# Patient Record
Sex: Male | Born: 1945
Health system: Southern US, Community
[De-identification: ages and names within clinical notes are randomized; demographics above are authoritative.]

## PROBLEM LIST (undated history)

## (undated) DIAGNOSIS — M1990 Unspecified osteoarthritis, unspecified site: Secondary | ICD-10-CM

## (undated) DIAGNOSIS — H269 Unspecified cataract: Secondary | ICD-10-CM

## (undated) DIAGNOSIS — I10 Essential (primary) hypertension: Secondary | ICD-10-CM

## (undated) DIAGNOSIS — E119 Type 2 diabetes mellitus without complications: Secondary | ICD-10-CM

## (undated) HISTORY — DX: Essential (primary) hypertension: I10

## (undated) HISTORY — DX: Unspecified cataract: H26.9

## (undated) HISTORY — DX: Type 2 diabetes mellitus without complications: E11.9

## (undated) HISTORY — DX: Unspecified osteoarthritis, unspecified site: M19.90

## (undated) HISTORY — PX: PILONIDAL CYST EXCISION: SHX744

## (undated) HISTORY — PX: COLONOSCOPY: SHX174

---

## 1998-09-09 ENCOUNTER — Emergency Department (HOSPITAL_COMMUNITY): Admission: EM | Admit: 1998-09-09 | Discharge: 1998-09-09 | Payer: Self-pay

## 1999-10-01 ENCOUNTER — Encounter: Payer: Self-pay | Admitting: Internal Medicine

## 1999-10-01 ENCOUNTER — Encounter: Admission: RE | Admit: 1999-10-01 | Discharge: 1999-10-01 | Payer: Self-pay | Admitting: Internal Medicine

## 2001-05-19 ENCOUNTER — Encounter: Payer: Self-pay | Admitting: Internal Medicine

## 2001-05-19 ENCOUNTER — Encounter: Admission: RE | Admit: 2001-05-19 | Discharge: 2001-05-19 | Payer: Self-pay | Admitting: Internal Medicine

## 2002-08-14 ENCOUNTER — Emergency Department (HOSPITAL_COMMUNITY): Admission: EM | Admit: 2002-08-14 | Discharge: 2002-08-14 | Payer: Self-pay | Admitting: *Deleted

## 2002-08-26 ENCOUNTER — Emergency Department (HOSPITAL_COMMUNITY): Admission: EM | Admit: 2002-08-26 | Discharge: 2002-08-26 | Payer: Self-pay | Admitting: *Deleted

## 2006-05-18 ENCOUNTER — Emergency Department (HOSPITAL_COMMUNITY): Admission: EM | Admit: 2006-05-18 | Discharge: 2006-05-19 | Payer: Self-pay | Admitting: Emergency Medicine

## 2006-06-04 ENCOUNTER — Ambulatory Visit: Payer: Self-pay | Admitting: Cardiovascular Disease

## 2006-06-11 ENCOUNTER — Ambulatory Visit: Payer: Self-pay | Admitting: Cardiovascular Disease

## 2006-06-11 ENCOUNTER — Ambulatory Visit (HOSPITAL_COMMUNITY): Admission: RE | Admit: 2006-06-11 | Discharge: 2006-06-11 | Payer: Self-pay | Admitting: Cardiovascular Disease

## 2008-05-03 ENCOUNTER — Ambulatory Visit: Payer: Self-pay | Admitting: Cardiovascular Disease

## 2009-07-21 DIAGNOSIS — E119 Type 2 diabetes mellitus without complications: Secondary | ICD-10-CM | POA: Insufficient documentation

## 2009-07-21 DIAGNOSIS — R079 Chest pain, unspecified: Secondary | ICD-10-CM | POA: Insufficient documentation

## 2009-07-21 DIAGNOSIS — I10 Essential (primary) hypertension: Secondary | ICD-10-CM | POA: Insufficient documentation

## 2009-07-24 ENCOUNTER — Ambulatory Visit: Payer: Self-pay | Admitting: Cardiovascular Disease

## 2009-07-24 DIAGNOSIS — E782 Mixed hyperlipidemia: Secondary | ICD-10-CM

## 2010-10-22 ENCOUNTER — Ambulatory Visit: Payer: Self-pay | Admitting: Cardiovascular Disease

## 2010-10-22 ENCOUNTER — Encounter: Payer: Self-pay | Admitting: Cardiovascular Disease

## 2010-12-12 NOTE — Assessment & Plan Note (Signed)
Summary: N5A   Primary Provider:  Elmore Guise, MD  CC:  1 year ROV; No Complaints.  History of Present Illness: Glenn Moore is seen today for f/U of SSCP, elevated lipids and HTN.  His primary is [name].  He had a normal cardac CTA in 2007 and a calcium score of 12.  His diabetes is under borderline control.. I don't have and A1c but his sugars at home are running 130-140.  He gets occasional chest pain which sounds muscular and is delayed after he does something strenuous at work.  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  There is no associated SOB, diaphoresis, palpitations or syncope.  He is active and plays golf without problems  I think his BP could be better controlled and he was quite a bit higher today.  Also discussed low carb diet as he has A1C of 6.5 or higher  Current Problems (verified): 1)  Mixed Hyperlipidemia  (ICD-272.2) 2)  Hypertension  (ICD-401.9) 3)  Dm  (ICD-250.00) 4)  Chest Pain  (ICD-786.50)  Current Medications (verified): 1)  Metformin Hcl 850 Mg Tabs (Metformin Hcl) .Marland Kitchen.. 1 Tab By Mouth Two Times A Day 2)  Glimepiride 4 Mg Tabs (Glimepiride) .... 1/2 Tab Once Daily 3)  Doxazosin Mesylate 4 Mg Tabs (Doxazosin Mesylate) .Marland Kitchen.. 1 Tab By Mouth Once Daily 4)  Metoprolol Succinate 50 Mg Xr24h-Tab (Metoprolol Succinate) .... 1/2 Tab By Mouth Once Daily 5)  Lisinopril 10 Mg Tabs (Lisinopril) .... Take One Tablet By Mouth Daily 6)  Crestor 10 Mg Tabs (Rosuvastatin Calcium) .... Take 1 By Mouth Once Daily 7)  Clonazepam 0.5 Mg Tabs (Clonazepam) .... As Needed 8)  Vitamin C .... Take 1 By Mouth Once Daily  Allergies (verified): No Known Drug Allergies  Past History:  Past Medical History: Last updated: 07/21/2009 Current Problems:  HYPERTENSION (ICD-401.9) DM (ICD-250.00) CHEST PAIN (ICD-786.50) normal cardiac CT 06/2006 calcium score 12  Family History: Last updated: 07/21/2009  Remarkable for cardiac disease on the father's side.  Social History: Last updated:  07/21/2009  He is happily married.  He owns a Technical brewer.  He has  three children, all who have graduated from college, two from [place] and  one from [place].  Review of Systems       Denies fever, malais, weight loss, blurry vision, decreased visual acuity, cough, sputum, SOB, hemoptysis, pleuritic pain, palpitaitons, heartburn, abdominal pain, melena, lower extremity edema, claudication, or rash.   Vital Signs:  Patient profile:   65 year old male Height:      74 inches Weight:      210 pounds BMI:     27.06 Pulse rate:   74 / minute Pulse rhythm:   regular BP sitting:   158 / 90  (left arm) Cuff size:   regular  Vitals Entered By: Stanton Kidney, EMT-P (October 22, 2010 10:22 AM)  Physical Exam  General:  Affect appropriate Healthy:  appears stated age HEENT: normal Neck supple with no adenopathy JVP normal no bruits no thyromegaly Lungs clear with no wheezing and good diaphragmatic motion Heart:  S1/S2 no murmur,rub, gallop or click PMI normal Abdomen: benighn, BS positve, no tenderness, no AAA no bruit.  No HSM or HJR Distal pulses intact with no bruits No edema Neuro non-focal Skin warm and dry    Impression & Recommendations:  Problem # 1:  MIXED HYPERLIPIDEMIA (ICD-272.2) Continue statin  Labs with primary His updated medication list for this problem includes:    Crestor 10 Mg  Tabs (Rosuvastatin calcium) .Marland Kitchen... Take 1 by mouth once daily  Orders: EKG w/ Interpretation (93000)  Problem # 2:  HYPERTENSION (ICD-401.9)  Increase Lisionpril  Home readings  F/U me 3 months His updated medication list for this problem includes:    Doxazosin Mesylate 4 Mg Tabs (Doxazosin mesylate) .Marland Kitchen... 1 tab by mouth once daily    Metoprolol Succinate 50 Mg Xr24h-tab (Metoprolol succinate) .Marland Kitchen... 1/2 tab by mouth once daily    Lisinopril 10 Mg Tabs (Lisinopril) .Marland Kitchen... Take one tablet by mouth daily  Orders: EKG w/ Interpretation (93000)  His updated medication list for  this problem includes:    Doxazosin Mesylate 4 Mg Tabs (Doxazosin mesylate) .Marland Kitchen... 1 tab by mouth once daily    Metoprolol Succinate 50 Mg Xr24h-tab (Metoprolol succinate) .Marland Kitchen... 1/2 tab by mouth once daily    Lisinopril 20 Mg Tabs (Lisinopril) ..... One daily  Problem # 3:  DM (ICD-250.00) Discussed [place] type diet  Lots of room for improvement.  Continue 2 oral hypoglycemis His updated medication list for this problem includes:    Metformin Hcl 850 Mg Tabs (Metformin hcl) .Marland Kitchen... 1 tab by mouth two times a day    Glimepiride 4 Mg Tabs (Glimepiride) .Marland Kitchen... 1/2 tab once daily    Lisinopril 10 Mg Tabs (Lisinopril) .Marland Kitchen... Take one tablet by mouth daily  Orders: EKG w/ Interpretation (93000)  Problem # 4:  CHEST PAIN (ICD-786.50) Non recurrent  Normal ECG in office today His updated medication list for this problem includes:    Metoprolol Succinate 50 Mg Xr24h-tab (Metoprolol succinate) .Marland Kitchen... 1/2 tab by mouth once daily    Lisinopril 20 Mg Tabs (Lisinopril) ..... One daily  Patient Instructions: 1)  Your physician recommends that you schedule a follow-up appointment in: 3 months with Dr Eden Emms 2)  Your physician has recommended you make the following change in your medication: increase Lisinopril 20 mg a day Prescriptions: LISINOPRIL 20 MG TABS (LISINOPRIL) one daily  #30 x 11   Entered by:   Charolotte Capuchin, RN   Authorized by:   Colon Branch, MD, [facility]   Signed by:   Charolotte Capuchin, RN on 10/22/2010   Method used:   Electronically to        [facility]. [phone]* (retail)       [phone]       [place], [place]  [address]       Ph: [phone]       Fax: [phone]   RxID:   [id number]    EKG Report  Procedure date:  10/22/2010  Findings:      NSR 74 normal ECG

## 2011-02-21 ENCOUNTER — Encounter: Payer: Self-pay | Admitting: Cardiovascular Disease

## 2011-02-25 ENCOUNTER — Ambulatory Visit (INDEPENDENT_AMBULATORY_CARE_PROVIDER_SITE_OTHER): Payer: BLUE CROSS/BLUE SHIELD | Admitting: Cardiovascular Disease

## 2011-02-25 ENCOUNTER — Encounter: Payer: Self-pay | Admitting: Cardiovascular Disease

## 2011-02-25 DIAGNOSIS — E119 Type 2 diabetes mellitus without complications: Secondary | ICD-10-CM

## 2011-02-25 DIAGNOSIS — I10 Essential (primary) hypertension: Secondary | ICD-10-CM

## 2011-02-25 DIAGNOSIS — E782 Mixed hyperlipidemia: Secondary | ICD-10-CM

## 2011-02-25 DIAGNOSIS — R079 Chest pain, unspecified: Secondary | ICD-10-CM

## 2011-02-25 NOTE — Assessment & Plan Note (Signed)
Well controlled.  Continue current medications and low sodium [place] type diet.    

## 2011-02-25 NOTE — Assessment & Plan Note (Signed)
At goal. Labs per Dr Chilton Si

## 2011-02-25 NOTE — Assessment & Plan Note (Signed)
Resolved F/U cardiac CT if symptoms return

## 2011-02-25 NOTE — Progress Notes (Signed)
Glenn Moore is seen today for f/U of SSCP, elevated lipids and HTN.  His primary is [name].  He had a normal cardac CTA in 2007 and a calcium score of 12.  His diabetes is under borderline control.. I don't have and A1c but his sugars at home are running 130-140.  He had his BS medicine adjusted an is running better. No SSCP, dyspnea or palpitaitons.  Golfing at [place] a lot.  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  There is no associated SOB, diaphoresis, palpitations or syncope.  He is active and plays golf without problems  ROS: Denies fever, malais, weight loss, blurry vision, decreased visual acuity, cough, sputum, SOB, hemoptysis, pleuritic pain, palpitaitons, heartburn, abdominal pain, melena, lower extremity edema, claudication, or rash.   General: Affect appropriate Healthy:  appears stated age HEENT: normal Neck supple with no adenopathy JVP normal no bruits no thyromegaly Lungs clear with no wheezing and good diaphragmatic motion Heart:  S1/S2 no murmur,rub, gallop or click PMI normal Abdomen: benighn, BS positve, no tenderness, no AAA no bruit.  No HSM or HJR Distal pulses intact with no bruits No edema Neuro non-focal Skin warm and dry No muscular weakness   Current Outpatient Prescriptions  Medication Sig Dispense Refill  . clonazePAM (KLONOPIN) 0.5 MG tablet Take 0.5 mg by mouth 2 (two) times daily as needed.        . doxazosin (CARDURA) 4 MG tablet Take 4 mg by mouth at bedtime.        Marland Kitchen glimepiride (AMARYL) 4 MG tablet 1 1/2 tab po qd      . lisinopril (PRINIVIL,ZESTRIL) 20 MG tablet Take 20 mg by mouth daily.        . metFORMIN (GLUCOPHAGE) 850 MG tablet Take 850 mg by mouth 2 (two) times daily with a meal.        . metoprolol (TOPROL-XL) 50 MG 24 hr tablet 1/2 tab po qd       . rosuvastatin (CRESTOR) 10 MG tablet Take 10 mg by mouth daily.          Allergies  Review of patient's allergies indicates no known allergies.  Electrocardiogram:  Assessment and  Plan

## 2011-02-25 NOTE — Patient Instructions (Signed)
Your physician recommends that you schedule a follow-up appointment in: ONE YEAR 

## 2011-02-25 NOTE — Assessment & Plan Note (Signed)
Improved with additin of glimperide  F/U Dr Chilton Si

## 2011-03-25 NOTE — Assessment & Plan Note (Signed)
[facility]                            CARDIOLOGY OFFICE NOTE   NAME:Glenn Moore, Glenn Moore                    MRN:          [id number]  DATE:05/03/2008                            DOB:          10-01-1946    Glenn Moore returns today for followup.  He has had previous chest pain.  It  has been atypical and it is occasionally recurrent and it sounds  musculoskeletal.  He had a cardiac CT 2 years ago, which showed a  calcium score of 12, low normal EF, and no significant coronary disease.   Unfortunately, Glenn Moore's blood sugar continues to be poorly controlled.  His fastings tend to be 140-150.  I do not have a hemoglobin A1c on him  and he does not know the exact dosages of his medications.   Aside from atypical pain, he is doing well.  He is not having exertional  dyspnea.   We had a long discussion regarding this.  He needs to see a  nutritionist.  I will try to get him to see Harvette Collins off  [address].   The patient understands that he needs to be a little bit stricter in  following his sugars.  I gave him a target hemoglobin A1c of below 6.5.  I encouraged him to follow up with Dr. Chilton Si.  His blood pressure  continues to be elevated.  He is a diabetic and should be on an ACE  inhibitor for its renal protective effects.   REVIEW OF SYSTEMS:  Otherwise, negative.   Meds:  Incomplete list.  Patient to call in accurate list.  See below   PHYSICAL EXAMINATION:  GENERAL:  Remarkable for being overweight at 211  pounds.  Blood pressure was 175/90,  pulse 83 and regular, respiratory  rate 14, afebrile.  HEENT:  Unremarkable.  Carotids normal, without bruit, no  lymphadenopathy, thyromegaly or JVP elevation.  LUNGS:  Clear.  Good diaphragmatic motion.  No wheezing.  HEART:  S1 and S2.  Normal heart sounds, PMI normal.  ABDOMEN:  Normal bowel sounds, positive, no AAA, no bruit, no  tenderness, no hepatosplenomegaly.  No hepatojugular reflux, distal  pulses were intact, no edema.  NEURO:  Nonfocal.  SKIN:  Warm and dry.  No muscular weakness.   EKG is entirely normal.   IMPRESSION:  1. Atypical chest pain.  Cardiac CT 2 years ago, normal with calcium      score 12, possible followup Myoview in a year or 2 if diabetes      continues to be poorly controlled.  2. Diabetes.  Follow with Dr. Nila Nephew, continue metformin b.i.d.,      may need the addition of Januvia.  3. Hypertension.  Continue metoprolol, dose unknown per the patient.      He will call him with his med list, add lisinopril 10 mg a day.  I      gave him a script to make sure Dr. Nila Nephew checks the BMET in 6-      8 weeks.   I will see him back in a year's time.  Noralyn Pick. Eden Emms, MD, [facility]  Electronically Signed    PCN/MedQ  DD: 05/03/2008  DT: 05/04/2008  Job #: [id number]   cc:   Erskine Speed, M.D.

## 2011-03-28 NOTE — Assessment & Plan Note (Signed)
[facility]                              CARDIOLOGY OFFICE NOTE   NAME:Glenn Moore, Glenn Moore                    MRN:          [id number]  DATE:06/04/2006                            DOB:          08/17/1946    REASON FOR VISIT:  Glenn Moore is a delightful 65 year old patient of Dr.  Elmore Guise.  He was in the ER two weeks ago for chest pain.   He apparently a long time ago has had chest pain back in 2002.  He does not  recall it, but I have a record of a bone scan which was negative at the  time.   The patient has had previous duodenitis.  The pain was somewhat similar, but  he had some exacerbation with walking.  He also felt palpitations and was  found to have PVCs in the ER.   He ruled out for myocardial infarction and was sent home for cardiologic  followup.   Since that time, he has been started on Glucophage for diabetes.  He has a  positive family history for premature coronary disease.  His father was a  patient of ours and had CABG in his 22s.   The patient does not smoke.   I am unaware of his lipid levels.   The patient does not have any significant previous surgical history, except  for pilonidal cyst.   He has rosacea and is on __________ for this per Dr. Romeo Apple.   He does have hypertension and has been taking Toprol-XL 25 mg a day and  Cardura 4 mg a day.   REVIEW OF SYSTEMS:  Otherwise benign.   FAMILY HISTORY:  Remarkable for cardiac disease on the father's side.   ALLERGIES:  THE PATIENT HAS NO KNOWN ALLERGIES.   SOCIAL HISTORY:  He is happily married.  He owns a Technical brewer.  He has  three children, all who have graduated from college, two from [place] and  one from [place].   His daughter just passed the bar exam.   They are all healthy and doing well.   PHYSICAL EXAMINATION:  VITAL SIGNS:  Blood pressure 128/78, pulse 70 and  regular.  LUNGS:  Clear.  NECK:  Carotids normal.  HEART:  There is an S1 and  S2.  Normal heart sounds.  ABDOMEN:  Benign.  EXTREMITIES:  Lower extremities with normal pulses, no edema.   DIAGNOSTIC STUDIES:  Baseline EKG is normal.   IMPRESSION:  The patient has multiple coronary risk factors, including  diabetes.   He has a relatively low heart rate and a good electrocardiogram.  I think  the ideal test for him to rule out structural heart disease in regards to  his premature ventricular contractions and also rule out coronary disease in  regards to his premature ventricular contractions would be a cardiac CT.   He is already on Toprol-XL.  We would have him take one tablet at night and  two the morning of.  We will try to get the cardiac CT cleared through  [facility].  If he cannot have a cardiac CT, he  will have to have a stress  Myoview and echocardiogram.   Further recommendations will be based on these tests.                               Noralyn Pick. Eden Emms, MD, [facility]    PCN/MedQ  DD:  06/04/2006  DT:  06/04/2006  Job #:  [id number]   cc:   Erskine Speed, MD

## 2012-03-10 ENCOUNTER — Telehealth: Payer: Self-pay | Admitting: Cardiovascular Disease

## 2012-03-10 NOTE — Telephone Encounter (Signed)
New Problem:     I called the patient and was unable to reach them. I left a message on their voicemail with my name, the reason I called, the name of his physician, and a number to call back to schedule their appointment. 

## 2012-05-11 ENCOUNTER — Ambulatory Visit: Payer: BLUE CROSS/BLUE SHIELD | Admitting: Cardiovascular Disease

## 2012-05-19 ENCOUNTER — Encounter: Payer: Self-pay | Admitting: Cardiovascular Disease

## 2012-05-19 ENCOUNTER — Ambulatory Visit (INDEPENDENT_AMBULATORY_CARE_PROVIDER_SITE_OTHER): Payer: BC Managed Care – PPO | Admitting: Cardiovascular Disease

## 2012-05-19 VITALS — BP 138/86 | HR 77 | Resp 18 | Ht 74.0 in | Wt 211.8 lb

## 2012-05-19 DIAGNOSIS — E119 Type 2 diabetes mellitus without complications: Secondary | ICD-10-CM

## 2012-05-19 DIAGNOSIS — I10 Essential (primary) hypertension: Secondary | ICD-10-CM

## 2012-05-19 DIAGNOSIS — R079 Chest pain, unspecified: Secondary | ICD-10-CM

## 2012-05-19 DIAGNOSIS — E782 Mixed hyperlipidemia: Secondary | ICD-10-CM

## 2012-05-19 NOTE — Addendum Note (Signed)
Addended by: Scherrie Bateman E on: 05/19/2012 10:00 AM   Modules accepted: Orders

## 2012-05-19 NOTE — Progress Notes (Signed)
Patient ID: Glenn Moore, male   DOB: 1946/01/12, 66 y.o.   MRN: [id number] Glenn Moore is seen today for f/U of SSCP, elevated lipids and HTN. His primary is Glenn Moore. He had a normal cardac CTA in 2007 and a calcium score of 12. His diabetes is under borderline control.. I don't have and A1c but his sugars at home are running 130-140. He had his BS medicine adjusted an is running better. No  dyspnea or palpitaitons. Golfing at [place] a lot. 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 There is no associated SOB, diaphoresis, palpitations or syncope. He is active and plays golf without problems  He does get occasional chest pain Seems more related to back and he gets a chiropractic adjustment occasionally.  Pain is crampy/sharp with exertion and radiates to the chest anteriorly.  Pain occurs couple times / month   ROS: Denies fever, malais, weight loss, blurry vision, decreased visual acuity, cough, sputum, SOB, hemoptysis, pleuritic pain, palpitaitons, heartburn, abdominal pain, melena, lower extremity edema, claudication, or rash.  All other systems reviewed and negative  General: Affect appropriate Healthy:  appears stated age HEENT: normal Neck supple with no adenopathy JVP normal no bruits no thyromegaly Lungs clear with no wheezing and good diaphragmatic motion Heart:  S1/S2 no murmur, no rub, gallop or click PMI normal Abdomen: benighn, BS positve, no tenderness, no AAA no bruit.  No HSM or HJR Distal pulses intact with no bruits No edema Neuro non-focal Skin warm and dry No muscular weakness   Current Outpatient Prescriptions  Medication Sig Dispense Refill  . doxazosin (CARDURA) 4 MG tablet Take 4 mg by mouth at bedtime.        Marland Kitchen glimepiride (AMARYL) 4 MG tablet 1 1/2 tab po qd      . lisinopril (PRINIVIL,ZESTRIL) 20 MG tablet Take 20 mg by mouth daily.        . metFORMIN (GLUCOPHAGE) 850 MG tablet Take 850 mg by mouth 2 (two) times daily with a meal.        . metoprolol  (TOPROL-XL) 50 MG 24 hr tablet 1/2 tab po qd       . rosuvastatin (CRESTOR) 10 MG tablet Take 10 mg by mouth daily.        . clonazePAM (KLONOPIN) 0.5 MG tablet Take 0.5 mg by mouth 2 (two) times daily as needed.          Allergies  Review of patient's allergies indicates no known allergies.  Electrocardiogram:  Assessment and Plan

## 2012-05-19 NOTE — Assessment & Plan Note (Signed)
Refer to nutritionalist.  Carbs in diet are a problem  He thinks last A1c was in the 7 range

## 2012-05-19 NOTE — Assessment & Plan Note (Signed)
Calcium score 12 in 2007 no functional study.  Poorly controlled DM  F/U ETT

## 2012-05-19 NOTE — Assessment & Plan Note (Signed)
Well controlled.  Continue current medications and low sodium [place] type diet.    

## 2012-05-19 NOTE — Patient Instructions (Signed)
Your physician has requested that you have an exercise tolerance test. For further information please visit [email or web address] Please also follow instruction sheet, as given. DX DM Your physician recommends that you continue on your current medications as directed. Please refer to the Current Medication list given to you today. You have been referred to NUTRITIONIST DX DM

## 2012-05-19 NOTE — Assessment & Plan Note (Signed)
Cholesterol is at goal.  Continue current dose of statin and diet Rx.  No myalgias or side effects.  F/U  LFT's in 6 months. No results found for this basename: LDLCALC             

## 2012-06-17 ENCOUNTER — Encounter: Payer: Self-pay | Admitting: Physician Assistant

## 2012-06-17 ENCOUNTER — Ambulatory Visit (INDEPENDENT_AMBULATORY_CARE_PROVIDER_SITE_OTHER): Payer: BC Managed Care – PPO | Admitting: Physician Assistant

## 2012-06-17 DIAGNOSIS — E119 Type 2 diabetes mellitus without complications: Secondary | ICD-10-CM

## 2012-06-17 NOTE — Procedures (Signed)
Exercise Treadmill Test  Pre-Exercise Testing Evaluation Rhythm: normal sinus  Rate: 70   PR:  .09 QRS:  .09  QT: .15 QTc: .41     Test  Exercise Tolerance Test Ordering MD: Charlton Haws, MD  Interpreting MD: Tereso Newcomer PA-C  Unique Test No: 1  Treadmill:  1  Indication for ETT: Diabetes  Contraindication to ETT: No   Stress Modality: exercise - treadmill  Cardiac Imaging Performed: non   Protocol: standard Bruce - maximal  Max BP:  211/69  Max MPHR (bpm):  157 85% MPR (bpm):  133  MPHR obtained (bpm):  151 % MPHR obtained:  96%  Reached 85% MPHR (min:sec):  7:37 Total Exercise Time (min-sec):  9:30  Workload in METS:  11.1 Borg Scale: 17  Reason ETT Terminated:  patient's desire to stop    ST Segment Analysis At Rest: normal ST segments - no evidence of significant ST depression With Exercise: no evidence of significant ST depression  Other Information Arrhythmia:  Rare PVC Angina during ETT:  absent (0) Quality of ETT:  diagnostic  ETT Interpretation:  normal - no evidence of ischemia by ST analysis  Comments: Good exercise tolerance. No chest pain. Normal BP response to exercise. No ST-T changes to suggest ischemia.   Recommendations: Follow up with Dr. Charlton Haws as directed. Tereso Newcomer, PA-C  11:16 AM 06/17/2012

## 2012-06-23 ENCOUNTER — Encounter: Payer: Self-pay | Admitting: Dietician

## 2012-06-23 ENCOUNTER — Encounter: Payer: BC Managed Care – PPO | Attending: Cardiovascular Disease | Admitting: Dietician

## 2012-06-23 VITALS — Ht 74.0 in | Wt 210.7 lb

## 2012-06-23 DIAGNOSIS — Z713 Dietary counseling and surveillance: Secondary | ICD-10-CM | POA: Insufficient documentation

## 2012-06-23 DIAGNOSIS — E119 Type 2 diabetes mellitus without complications: Secondary | ICD-10-CM | POA: Insufficient documentation

## 2012-06-23 NOTE — Progress Notes (Signed)
Medical Nutrition Therapy:  Appt start time: 1545 end time:  1655   Assessment:  Primary concerns today: Concerned that his A1C is increasing and he has been trying to adapt his diet to help with lowering his glucose levels. He has not had any formal diabetes education in the past and comes with some questions regarding his diet and glucose control. He currently is taking glimepiride and metformin for glucose control.  His wife comes today as a second set of ears,to help him with controlling his glucose.  HYPOGLYCEMIA: Gives some recent H/O having low blood glucose.  It happens most often on the warmer days when he has been more physically active.  It has occurred on the golf course and at home following golfing while he was mowing the lawn with a push mower.  It is most often at mid-day.  We reviewed the treatment for hypoglycemia and he was encouraged to get some glucose tabs to keep with him to treat a low when away from home.  HYPERGLYCEMIA:  No S/S noted for the overt 240-250 mg glucose levels.  BLOOD GLUCOSE LEVELS: Checking fasting glucose as a rule.  Generally will run in the 150-160 range.  Has had some episodes in the recent past where he was at 120-112 mg/dl at the fasting times.  MEDICATIONS: Med review completed.  Diabetes: Metformin 850 mg 1 two times per day.  Glimepiride 2 (4 mg tab) daily in the morning prior to the breakfast. Given the low blood glucose and the higher dose of the Glimepiride, this gentleman might be able to use one of the DDP-4 Inhibitors or the new SGLT2 Inhibitor (Invokana/canagliflozin).  Both might not pump the pancrease as the Glimepiride is doing.   DIETARY INTAKE:  Usual eating pattern includes 3 meals and none snacks per day.  Everyday foods include variety of meats, fruits, and vegetables.  Avoided foods include: liver, grits    24-hr recall:  B ( AM): 7:00 coffee  With a little cream or milk, Nutra grain bar or banana and sometimes maybe both.   Then; Ashawna Hanback have breakfast; [place] for a bacon, egg, biscuit with an unsweetened tea.  Snk ( AM): none  L ( PM): 12:30-1:30 Sandwich or hamburger BBQ slaw plate ,Jay's roast beef sandwich with the carrots today. D ( PM): 6:30-7:00 2 BBQ pork chops with potatoes, salad with some blue cheese dressing. 1-2 glasses of wine. Snk ( PM): no  Beverages: coffee, unsweetened, wine, water, or mixed drink occasionally. Sometimes an appetizer of healthy snacks with a mixed drink after golf.  Usual physical activity: golf 2-3 times per week       Estimated energy needs:Ht:74 in  Wt: 210.7 lb BMI: 27.1 kg/m2  Adj WT: 200 lb (91 kg) 1800 calories 200-205 g carbohydrates 130-135 g protein 48-50 g fat  Progress Towards Goal(s):  In progress.   Nutritional Diagnosis:  [place]-2.1 Inpaired nutrition utilization As related to blood glucose.  As evidenced by diagnosis of type 2 diabetes, fasting blood glucose of 140-150,,and A1C reported at 7.5%..    Intervention:  Nutrition Has been working to limit his carb intake.  Reviewed the carb restricted diet for DM.  Review of label reading and use of the exchanges for counting carbs.  Recommended aiming for 45-60 gm of carb per meal and to have a protein source at all meals and snacks   Handouts given during visit include:  Living Well with Diabetes  Carbohydrate Counting Guide  Yellow card with the  45-60 gm CHO prescription and the exchanges/portions  7 day menu suggestions for the 45 and 60 gm carb diets.  Monitoring/Evaluation:  Dietary intake, exercise, blood glucose levels, and body weight to call with questions or e-mail.Marland Kitchen

## 2012-06-23 NOTE — Patient Instructions (Addendum)
   Low blood glucose:  Try to get some glucose tablets to have with you when experiencing the symptoms of low blood sugar. Drink some water or fluid with the tables.  Use 4 tablets to get the 15 gm of glucose.  You might use 4 ounces of regular juice or soda if the tablets are not available.  If you have some of the beginning symptoms, you might use the Pb crackers before the symptoms increase.  On the days that it is warmer and you plan to be more physically active, plan to have a breakfast that has protein and at least 45-60 gm of the carb.  Try to get some walking in the neighborhood or on the treadmill in on those days that you are not golfing and not that active at work.  Aim for 30-45 minutes or whatever the schedule allows.  Continue to not skip meals.  Have something.  Continue to have the lean meats along with a large serving of the non-starchy vegetable, and keep the starch serving at about a cup at the meal.  Read your food labels for portions, and try measuring your portions at times to see exactly how much you are getting in the carb and fat servings.   Try measuring the serving of the salad dressing.  Continue to try to drop the the 200 lb weight level.  That will help with lowering the A1C.  I enjoyed working with you and your wife this afternoon.  If you have any questions, please give me a call or an e-mail.

## 2012-08-12 ENCOUNTER — Encounter: Payer: Self-pay | Admitting: Internal Medicine

## 2012-08-18 ENCOUNTER — Encounter: Payer: Self-pay | Admitting: Internal Medicine

## 2012-08-27 ENCOUNTER — Encounter: Payer: Self-pay | Admitting: Cardiovascular Disease

## 2012-09-21 ENCOUNTER — Ambulatory Visit (AMBULATORY_SURGERY_CENTER): Payer: BC Managed Care – PPO

## 2012-09-21 VITALS — Ht 74.0 in | Wt 215.1 lb

## 2012-09-21 DIAGNOSIS — Z1211 Encounter for screening for malignant neoplasm of colon: Secondary | ICD-10-CM

## 2012-09-21 MED ORDER — MOVIPREP 100 G PO SOLR: ORAL | Status: DC

## 2012-09-22 ENCOUNTER — Encounter: Payer: Self-pay | Admitting: Internal Medicine

## 2012-10-12 ENCOUNTER — Telehealth: Payer: Self-pay | Admitting: Internal Medicine

## 2012-10-12 DIAGNOSIS — Z1211 Encounter for screening for malignant neoplasm of colon: Secondary | ICD-10-CM

## 2012-10-12 MED ORDER — PEG-KCL-NACL-NASULF-NA ASC-C 100 G PO SOLR: ORAL | Status: DC

## 2012-10-12 NOTE — Telephone Encounter (Signed)
Moviprep sent to the correct [facility] on [address].  Pt aware

## 2012-10-19 ENCOUNTER — Ambulatory Visit (AMBULATORY_SURGERY_CENTER): Payer: BC Managed Care – PPO | Admitting: Internal Medicine

## 2012-10-19 ENCOUNTER — Encounter: Payer: Self-pay | Admitting: Internal Medicine

## 2012-10-19 VITALS — BP 158/87 | HR 74 | Temp 96.5°F | Resp 11 | Ht 74.0 in | Wt 215.0 lb

## 2012-10-19 DIAGNOSIS — Z1211 Encounter for screening for malignant neoplasm of colon: Secondary | ICD-10-CM

## 2012-10-19 MED ORDER — SODIUM CHLORIDE 0.9 % IV SOLN: 500.0000 mL | INTRAVENOUS | Status: DC

## 2012-10-19 NOTE — Op Note (Signed)
[facility] [address] [place], [address]   COLONOSCOPY PROCEDURE REPORT  PATIENT: Kinsler, Soeder  MR#: [id number] BIRTHDATE: 1946-02-12 , 66  yrs. old GENDER: Male ENDOSCOPIST: Hart Carwin, MD REFERRED BY:  Nila Nephew, M.D. PROCEDURE DATE:  10/19/2012 PROCEDURE:   Colonoscopy, screening ASA CLASS:   Class II INDICATIONS:Average risk patient for colon cancer and lasy colon 2003. MEDICATIONS: MAC sedation, administered by CRNA and propofol (Diprivan) 200mg  IV  DESCRIPTION OF PROCEDURE:   After the risks and benefits and of the procedure were explained, informed consent was obtained.  A digital rectal exam revealed no abnormalities of the rectum.    The LB [id number]  endoscope was introduced through the anus and advanced to the cecum, which was identified by both the appendix and ileocecal valve .  The quality of the prep was excellent, using MoviPrep .  The instrument was then slowly withdrawn as the colon was fully examined.     COLON FINDINGS: Mild diverticulosis was noted.     Retroflexed views revealed no abnormalities.     The scope was then withdrawn from the patient and the procedure completed.  COMPLICATIONS: There were no complications. ENDOSCOPIC IMPRESSION: Mild diverticulosis was noted  RECOMMENDATIONS: High fiber diet   REPEAT EXAM: In 10 year(s)  for Colonoscopy.  cc:  _______________________________ eSignedHart Carwin, MD 10/19/2012 8:50 AM

## 2012-10-19 NOTE — Progress Notes (Signed)
Patient did not experience any of the following events: a burn prior to discharge; a fall within the facility; wrong site/side/patient/procedure/implant event; or a hospital transfer or hospital admission upon discharge from the facility. (G8907) Patient did not have preoperative order for IV antibiotic SSI prophylaxis. (G8918)  

## 2012-10-19 NOTE — Patient Instructions (Addendum)
YOU HAD AN ENDOSCOPIC PROCEDURE TODAY AT THE [facility]: Refer to the procedure report that was given to you for any specific questions about what was found during the examination.  If the procedure report does not answer your questions, please call your gastroenterologist to clarify.  If you requested that your care partner not be given the details of your procedure findings, then the procedure report has been included in a sealed envelope for you to review at your convenience later.  YOU SHOULD EXPECT: Some feelings of bloating in the abdomen. Passage of more gas than usual.  Walking can help get rid of the air that was put into your GI tract during the procedure and reduce the bloating. If you had a lower endoscopy (such as a colonoscopy or flexible sigmoidoscopy) you may notice spotting of blood in your stool or on the toilet paper. If you underwent a bowel prep for your procedure, then you may not have a normal bowel movement for a few days.  DIET: Your first meal following the procedure should be a light meal and then it is ok to progress to your normal diet.  A half-sandwich or bowl of soup is an example of a good first meal.  Heavy or fried foods are harder to digest and may make you feel nauseous or bloated.  Likewise meals heavy in dairy and vegetables can cause extra gas to form and this can also increase the bloating.  Drink plenty of fluids but you should avoid alcoholic beverages for 24 hours.  ACTIVITY: Your care partner should take you home directly after the procedure.  You should plan to take it easy, moving slowly for the rest of the day.  You can resume normal activity the day after the procedure however you should NOT DRIVE or use heavy machinery for 24 hours (because of the sedation medicines used during the test).    SYMPTOMS TO REPORT IMMEDIATELY: A gastroenterologist can be reached at any hour.  During normal business hours, 8:30 AM to 5:00 PM Monday through Friday,  call [phone].  After hours and on weekends, please call the GI answering service at [phone] who will take a message and have the physician on call contact you.   Following lower endoscopy (colonoscopy or flexible sigmoidoscopy):  Excessive amounts of blood in the stool  Significant tenderness or worsening of abdominal pains  Swelling of the abdomen that is new, acute  Fever of 100F or higher   FOLLOW UP: If any biopsies were taken you will be contacted by phone or by letter within the next 1-3 weeks.  Call your gastroenterologist if you have not heard about the biopsies in 3 weeks.  Our staff will call the home number listed on your records the next business day following your procedure to check on you and address any questions or concerns that you may have at that time regarding the information given to you following your procedure. This is a courtesy call and so if there is no answer at the home number and we have not heard from you through the emergency physician on call, we will assume that you have returned to your regular daily activities without incident.  SIGNATURES/CONFIDENTIALITY: You and/or your care partner have signed paperwork which will be entered into your electronic medical record.  These signatures attest to the fact that that the information above on your After Visit Summary has been reviewed and is understood.  Full responsibility of the confidentiality of   this discharge information lies with you and/or your care-partner.    Diverticulosis and high fiber information given.  Next colonoscopy 10 years-2023 

## 2012-10-20 ENCOUNTER — Telehealth: Payer: Self-pay

## 2012-10-20 NOTE — Telephone Encounter (Signed)
  Follow up Call-  Call back number 10/19/2012  Post procedure Call Back phone  # [phone]  Permission to leave phone message Yes     Patient questions:  Do you have a fever, pain , or abdominal swelling? no Pain Score  0 *  Have you tolerated food without any problems? yes  Have you been able to return to your normal activities? yes  Do you have any questions about your discharge instructions: Diet   no Medications  no Follow up visit  no  Do you have questions or concerns about your Care? no  Actions: * If pain score is 4 or above: No action needed, pain <4.

## 2013-07-27 ENCOUNTER — Ambulatory Visit (INDEPENDENT_AMBULATORY_CARE_PROVIDER_SITE_OTHER): Payer: BC Managed Care – PPO | Admitting: Cardiovascular Disease

## 2013-07-27 ENCOUNTER — Encounter: Payer: Self-pay | Admitting: Cardiovascular Disease

## 2013-07-27 VITALS — BP 164/90 | HR 80 | Wt 211.0 lb

## 2013-07-27 DIAGNOSIS — R079 Chest pain, unspecified: Secondary | ICD-10-CM

## 2013-07-27 DIAGNOSIS — E119 Type 2 diabetes mellitus without complications: Secondary | ICD-10-CM

## 2013-07-27 DIAGNOSIS — E782 Mixed hyperlipidemia: Secondary | ICD-10-CM

## 2013-07-27 DIAGNOSIS — I10 Essential (primary) hypertension: Secondary | ICD-10-CM

## 2013-07-27 NOTE — Assessment & Plan Note (Signed)
Consider increasing ACE or adding diuretic in future Discussed low sodium diet Continue current meds and f/u Dr Chilton Si

## 2013-07-27 NOTE — Assessment & Plan Note (Signed)
Cholesterol is at goal.  Continue current dose of statin and diet Rx.  No myalgias or side effects.  F/U  LFT's in 6 months. No results found for this basename: [facility]  Labs with Dr Elmore Guise

## 2013-07-27 NOTE — Assessment & Plan Note (Signed)
Discussed low carb diet.  Target hemoglobin A1c is 6.5 or less.  Continue current medications. A1c checked by Dr Chilton Si quarterly

## 2013-07-27 NOTE — Progress Notes (Signed)
Patient ID: Glenn Moore, male   DOB: [birth year], 67 y.o.   MRN: [id number] Glenn Moore is seen today for f/U of SSCP, elevated lipids and HTN. His primary is Glenn Moore. He had a normal cardac CTA in 2007 and a calcium score of 12. His diabetes is under borderline control.. I don't have and A1c but his sugars at home are running 130-140. He had his BS medicine adjusted an is running better. No dyspnea or palpitaitons. Golfing at [place] a lot. 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 There is no associated SOB, diaphoresis, palpitations or syncope. He is active and plays golf without problems  He does get occasional chest pain Seems more related to back and he gets a chiropractic adjustment occasionally. Pain is crampy/sharp with exertion and radiates to the chest anteriorly. Pain occurs couple times / month  BP high in our office today but ok at home and with Glenn Moore  Had barbecue at [facility] before visit  ROS: Denies fever, malais, weight loss, blurry vision, decreased visual acuity, cough, sputum, SOB, hemoptysis, pleuritic pain, palpitaitons, heartburn, abdominal pain, melena, lower extremity edema, claudication, or rash.  All other systems reviewed and negative  General: Affect appropriate Healthy:  appears stated age HEENT: normal Neck supple with no adenopathy JVP normal no bruits no thyromegaly Lungs clear with no wheezing and good diaphragmatic motion Heart:  S1/S2 no murmur, no rub, gallop or click PMI normal Abdomen: benighn, BS positve, no tenderness, no AAA no bruit.  No HSM or HJR Distal pulses intact with no bruits No edema Neuro non-focal Skin warm and dry No muscular weakness   Current Outpatient Prescriptions  Medication Sig Dispense Refill  . doxazosin (CARDURA) 4 MG tablet Take 4 mg by mouth at bedtime.        Marland Kitchen glimepiride (AMARYL) 4 MG tablet 8 mg daily before breakfast.       . lisinopril (PRINIVIL,ZESTRIL) 20 MG tablet Take 20 mg by mouth daily.        .  metFORMIN (GLUCOPHAGE) 850 MG tablet Take 850 mg by mouth 2 (two) times daily with a meal.        . metoprolol (TOPROL-XL) 50 MG 24 hr tablet Take 50 mg by mouth daily.       . rosuvastatin (CRESTOR) 10 MG tablet Take 5 mg by mouth every other day.       . vitamin C (ASCORBIC ACID) 500 MG tablet Take 500 mg by mouth daily.       No current facility-administered medications for this visit.    Allergies  Review of patient's allergies indicates no known allergies.  Electrocardiogram:  SR rate 80 normal   Assessment and Plan

## 2013-07-27 NOTE — Assessment & Plan Note (Signed)
Resolved low calcium score Continue risk factor modification and 81 mg ASA

## 2013-07-27 NOTE — Patient Instructions (Signed)
Your physician wants you to follow-up in: YEAR WITH DR NISHAN  You will receive a reminder letter in the mail two months in advance. If you don't receive a letter, please call our office to schedule the follow-up appointment.  Your physician recommends that you continue on your current medications as directed. Please refer to the Current Medication list given to you today. 

## 2014-08-01 ENCOUNTER — Ambulatory Visit: Payer: BC Managed Care – PPO | Admitting: Cardiovascular Disease

## 2014-08-01 ENCOUNTER — Ambulatory Visit (INDEPENDENT_AMBULATORY_CARE_PROVIDER_SITE_OTHER): Payer: Medicare Other | Admitting: Cardiovascular Disease

## 2014-08-01 ENCOUNTER — Encounter: Payer: Self-pay | Admitting: Cardiovascular Disease

## 2014-08-01 VITALS — BP 138/88 | HR 67 | Ht 74.0 in | Wt 208.0 lb

## 2014-08-01 DIAGNOSIS — I10 Essential (primary) hypertension: Secondary | ICD-10-CM

## 2014-08-01 DIAGNOSIS — E119 Type 2 diabetes mellitus without complications: Secondary | ICD-10-CM

## 2014-08-01 DIAGNOSIS — R079 Chest pain, unspecified: Secondary | ICD-10-CM

## 2014-08-01 DIAGNOSIS — E782 Mixed hyperlipidemia: Secondary | ICD-10-CM

## 2014-08-01 NOTE — Assessment & Plan Note (Signed)
Atypical normal ECG Multiple CRF;s  F/u ETT

## 2014-08-01 NOTE — Assessment & Plan Note (Signed)
A1c over 7  Metformin increased by Dr Nyoka Cowden  Discussed low carb diet

## 2014-08-01 NOTE — Progress Notes (Signed)
Patient ID: Glenn Moore, male   DOB: Jul 03, 1946, 68 y.o.   MRN: [id number] Glenn Moore is seen today for f/U of SSCP, elevated lipids and HTN. His primary is Glenn Moore. He had a normal cardac CTA in 2007 and a calcium score of 12. His diabetes is under borderline control.. I don't have and A1c but his sugars at home are running 130-140. He had his BS medicine adjusted an is running better. No dyspnea or palpitaitons. Golfing at [place] a lot. 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 There is no associated SOB, diaphoresis, palpitations or syncope. He is active and plays golf without problems  He does get occasional chest pain Seems more related to back and he gets a chiropractic adjustment occasionally. Pain is crampy/sharp with exertion and radiates to the chest anteriorly. Pain occurs couple times / month BP high in our office today but ok at home and with Dr Glenn Moore    Having some sharp left sided SSCP last few weeks  Golfing a lot  Pains last minutes  Can get them when he is resting  Golf does not exacerbate Compliante with meds    ROS: Denies fever, malais, weight loss, blurry vision, decreased visual acuity, cough, sputum, SOB, hemoptysis, pleuritic pain, palpitaitons, heartburn, abdominal pain, melena, lower extremity edema, claudication, or rash.  All other systems reviewed and negative  General: Affect appropriate Healthy:  appears stated age 68: normal Neck supple with no adenopathy JVP normal no bruits no thyromegaly Lungs clear with no wheezing and good diaphragmatic motion Heart:  S1/S2 no murmur, no rub, gallop or click PMI normal Abdomen: benighn, BS positve, no tenderness, no AAA no bruit.  No HSM or HJR Distal pulses intact with no bruits No edema Neuro non-focal Skin warm and dry No muscular weakness   Current Outpatient Prescriptions  Medication Sig Dispense Refill  . doxazosin (CARDURA) 4 MG tablet Take 4 mg by mouth at bedtime.        Marland Kitchen glimepiride (AMARYL) 4  MG tablet 8 mg daily before breakfast.       . lisinopril (PRINIVIL,ZESTRIL) 20 MG tablet Take 20 mg by mouth daily.        . metFORMIN (GLUCOPHAGE) 850 MG tablet Take 850 mg by mouth 3 (three) times daily.       . metoprolol (TOPROL-XL) 50 MG 24 hr tablet Take 50 mg by mouth daily.       . rosuvastatin (CRESTOR) 10 MG tablet Take 5 mg by mouth every other day.       . vitamin C (ASCORBIC ACID) 500 MG tablet Take 500 mg by mouth daily.       No current facility-administered medications for this visit.    Allergies  Review of patient's allergies indicates no known allergies.  Electrocardiogram: 9/14  SR rate 80 normal  Today NSR normal ECG rate 67  Assessment and Plan

## 2014-08-01 NOTE — Patient Instructions (Signed)
Your physician wants you to follow-up in:   YEAR WITH  DR NISHAN You will receive a reminder letter in the mail two months in advance. If you don't receive a letter, please call our office to schedule the follow-up appointment.  Your physician recommends that you continue on your current medications as directed. Please refer to the Current Medication list given to you today.   Your physician has requested that you have an exercise tolerance test. For further information please visit [email or web address]. Please also follow instruction sheet, as given.  

## 2014-08-01 NOTE — Assessment & Plan Note (Signed)
Well controlled.  Continue current medications and low sodium [place] type diet.    

## 2014-08-01 NOTE — Assessment & Plan Note (Signed)
Cholesterol is at goal.  Continue current dose of statin and diet Rx.  No myalgias or side effects.  F/U  LFT's in 6 months. No results found for this basename: [facility]   Labs per Dr Nyoka Cowden  Continue statin

## 2014-08-30 ENCOUNTER — Ambulatory Visit: Payer: BC Managed Care – PPO | Admitting: Cardiovascular Disease

## 2014-09-01 ENCOUNTER — Encounter: Payer: Medicare Other | Admitting: Physician Assistant

## 2014-09-05 ENCOUNTER — Ambulatory Visit (INDEPENDENT_AMBULATORY_CARE_PROVIDER_SITE_OTHER): Payer: Medicare Other | Admitting: Physician Assistant

## 2014-09-05 DIAGNOSIS — R079 Chest pain, unspecified: Secondary | ICD-10-CM

## 2014-09-05 NOTE — Progress Notes (Signed)
Exercise Treadmill Test  Pre-Exercise Testing Evaluation Rhythm: normal sinus  Rate: 71     Test  Exercise Tolerance Test Ordering MD: Jenkins Rouge, MD  Interpreting MD: Richardson Dopp, PA-C  Unique Test No: 1  Treadmill:  1  Indication for ETT: chest pain - rule out ischemia  Contraindication to ETT: No   Stress Modality: exercise - treadmill  Cardiac Imaging Performed: non   Protocol: standard Bruce - maximal  Max BP:  212/65  Max MPHR (bpm):  154 85% MPR (bpm):  131  MPHR obtained (bpm):  139 % MPHR obtained:  90  Reached 85% MPHR (min:sec):  6:04 Total Exercise Time (min-sec):  7:00  Workload in METS:  8.5 Borg Scale: 15  Reason ETT Terminated:  desired heart rate attained    ST Segment Analysis At Rest: normal ST segments - no evidence of significant ST depression With Exercise: no evidence of significant ST depression  Other Information Arrhythmia:  No Angina during ETT:  absent (0) Quality of ETT:  diagnostic  ETT Interpretation:  normal - no evidence of ischemia by ST analysis  Comments: Good exercise capacity. No chest pain. Normal BP response to exercise. No ST changes to suggest ischemia.   Recommendations: FU with Dr. Jenkins Rouge as directed. Signed,  Richardson Dopp, PA-C   09/05/2014 10:09 AM

## 2014-09-06 ENCOUNTER — Telehealth: Payer: Self-pay | Admitting: *Deleted

## 2014-09-06 NOTE — Telephone Encounter (Signed)
PT  AWARE OF  GXT  RESULTS ./CY 

## 2014-09-06 NOTE — Telephone Encounter (Signed)
LMTCB ./CY 

## 2014-09-06 NOTE — Telephone Encounter (Signed)
°  Patient is returning your call, please call back.  °

## 2014-09-06 NOTE — Telephone Encounter (Signed)
Message copied by Richmond Campbell on Wed Sep 06, 2014  8:33 AM ------      Message from: Josue Hector      Created: Tue Sep 05, 2014  1:38 PM       Normal ETT            ----- Message -----         From: Liliane Shi, PA-C         Sent: 09/05/2014  10:12 AM           To: Josue Hector, MD                   ------

## 2015-07-14 ENCOUNTER — Emergency Department (HOSPITAL_COMMUNITY)
Admission: EM | Admit: 2015-07-14 | Discharge: 2015-07-14 | Disposition: A | Discharge status: Home / Self Care | Payer: Medicare Other | Attending: Emergency Medicine | Admitting: Emergency Medicine

## 2015-07-14 ENCOUNTER — Emergency Department (HOSPITAL_COMMUNITY): Payer: Medicare Other

## 2015-07-14 ENCOUNTER — Encounter (HOSPITAL_COMMUNITY): Payer: Self-pay | Admitting: Emergency Medicine

## 2015-07-14 DIAGNOSIS — R079 Chest pain, unspecified: Secondary | ICD-10-CM | POA: Diagnosis not present

## 2015-07-14 DIAGNOSIS — I10 Essential (primary) hypertension: Secondary | ICD-10-CM | POA: Insufficient documentation

## 2015-07-14 DIAGNOSIS — E119 Type 2 diabetes mellitus without complications: Secondary | ICD-10-CM | POA: Insufficient documentation

## 2015-07-14 DIAGNOSIS — Z79899 Other long term (current) drug therapy: Secondary | ICD-10-CM | POA: Diagnosis not present

## 2015-07-14 LAB — CBC
HCT: 45 % (ref 39.0–52.0)
Hemoglobin: 15.4 g/dL (ref 13.0–17.0)
MCH: 31.2 pg (ref 26.0–34.0)
MCHC: 34.2 g/dL (ref 30.0–36.0)
MCV: 91.3 fL (ref 78.0–100.0)
PLATELETS: 167 10*3/uL (ref 150–400)
RBC: 4.93 MIL/uL (ref 4.22–5.81)
RDW: 12.7 % (ref 11.5–15.5)
WBC: 9.4 10*3/uL (ref 4.0–10.5)

## 2015-07-14 LAB — PROTIME-INR
INR: 1.01 (ref 0.00–1.49)
PROTHROMBIN TIME: 13.5 s (ref 11.6–15.2)

## 2015-07-14 LAB — I-STAT TROPONIN, ED
TROPONIN I, POC: 0 ng/mL (ref 0.00–0.08)
Troponin i, poc: 0 ng/mL (ref 0.00–0.08)
Troponin i, poc: 0 ng/mL (ref 0.00–0.08)

## 2015-07-14 LAB — BASIC METABOLIC PANEL
Anion gap: 11 (ref 5–15)
BUN: 21 mg/dL — ABNORMAL HIGH (ref 6–20)
CHLORIDE: 101 mmol/L (ref 101–111)
CO2: 28 mmol/L (ref 22–32)
CREATININE: 1.08 mg/dL (ref 0.61–1.24)
Calcium: 9.5 mg/dL (ref 8.9–10.3)
GFR calc non Af Amer: >60 mL/min (ref >60)
Glucose, Bld: 186 mg/dL — ABNORMAL HIGH (ref 65–99)
Potassium: 4 mmol/L (ref 3.5–5.1)
Sodium: 140 mmol/L (ref 135–145)

## 2015-07-14 LAB — APTT: aPTT: 26 seconds (ref 24–37)

## 2015-07-14 MED ORDER — PANTOPRAZOLE SODIUM 20 MG PO TBEC: 20.0000 mg | DELAYED_RELEASE_TABLET | Freq: Every day | ORAL | Status: DC

## 2015-07-14 MED ORDER — PANTOPRAZOLE SODIUM 40 MG IV SOLR
40.0000 mg | Freq: Once | INTRAVENOUS | Status: AC
  Administered 2015-07-14: 40 mg via INTRAVENOUS
  Filled 2015-07-14: qty 40

## 2015-07-14 MED ORDER — GI COCKTAIL ~~LOC~~
30.0000 mL | Freq: Once | ORAL | Status: AC
  Administered 2015-07-14: 30 mL via ORAL
  Filled 2015-07-14: qty 30

## 2015-07-14 MED ORDER — SODIUM CHLORIDE 0.9 % IV SOLN
1000.0000 mL | INTRAVENOUS | Status: DC
  Administered 2015-07-14: 1000 mL via INTRAVENOUS

## 2015-07-14 NOTE — ED Notes (Signed)
Pt from home c/o central chest tightness that woke him from his sleep. Denies shortness of breath or nausea.

## 2015-07-14 NOTE — ED Provider Notes (Signed)
CSN: [id number]   Arrival date & time 07/14/15 0146  History  This chart was scribed for Dorie Rank, MD by Altamease Oiler, ED Scribe. This patient was seen in room WA25/WA25 and the patient's care was started at 2:19 AM.  Chief Complaint  Patient presents with  . Chest Pain    HPI The history is provided by the patient. No language interpreter was used.   Glenn Moore is a 69 y.o. male with PMHx of DM and HTN who presents to the Emergency Department complaining of constant central chest pain with sudden onset around 1 AM. The non-radiating pain is described as tight and heavy with no modifying factors. He had a similar episode 4 nights ago after lifting something heavy at work. At that time he was evaluated by EMS personal and decided not to be seen in the ED. This pain is less severe than pain he had in the past requiring a cardiac CTA scan and stress test. Pt denies sweating, SOB, abdominal pain, and nausea. No significant cardiac history.   Past Medical History  Diagnosis Date  . HTN (hypertension)   . DM (diabetes mellitus)   . Chest pain     Past Surgical History  Procedure Laterality Date  . Pilonidal cyst excision    . Colonoscopy      Family History  Problem Relation Age of Onset  . Heart disease    . Diabetes Mother   . Heart disease Father     Social History  Substance Use Topics  . Smoking status: Never Smoker   . Smokeless tobacco: Never Used  . Alcohol Use: 2.4 oz/week    4 Glasses of wine per week     Review of Systems  Constitutional: Negative for diaphoresis.  Respiratory: Negative for shortness of breath.   Cardiovascular: Positive for chest pain.  Gastrointestinal: Negative for nausea, vomiting and abdominal pain.  10 Systems reviewed and all are negative for acute change except as noted in the HPI. Home Medications   Prior to Admission medications   Medication Sig Start Date End Date Taking? Authorizing Provider  acetaminophen (TYLENOL) 500 MG  tablet Take 1,000 mg by mouth every 6 (six) hours as needed for moderate pain.   Yes Historical Provider, MD  ALPRAZolam Duanne Moron) 0.5 MG tablet Take 0.5 mg by mouth at bedtime as needed for anxiety.   Yes Historical Provider, MD  desoximetasone (TOPICORT) 0.25 % cream Apply 1 application topically 2 (two) times daily as needed (rash).   Yes Historical Provider, MD  doxazosin (CARDURA) 4 MG tablet Take 4 mg by mouth daily.    Yes Historical Provider, MD  glimepiride (AMARYL) 4 MG tablet 8 mg daily before breakfast.    Yes Historical Provider, MD  lisinopril (PRINIVIL,ZESTRIL) 20 MG tablet Take 20 mg by mouth daily.     Yes Historical Provider, MD  metFORMIN (GLUCOPHAGE) 850 MG tablet Take 850 mg by mouth 3 (three) times daily.    Yes Historical Provider, MD  metoprolol (TOPROL-XL) 50 MG 24 hr tablet Take 50 mg by mouth daily.    Yes Historical Provider, MD  naproxen sodium (ANAPROX) 220 MG tablet Take 220 mg by mouth 2 (two) times daily as needed (pain).   Yes Historical Provider, MD  rosuvastatin (CRESTOR) 10 MG tablet Take 5 mg by mouth every other day.    Yes Historical Provider, MD  saxagliptin HCl (ONGLYZA) 5 MG TABS tablet Take 5 mg by mouth daily.   Yes Historical Provider,  MD  pantoprazole (PROTONIX) 20 MG tablet Take 1 tablet (20 mg total) by mouth daily. 07/14/15   Dorie Rank, MD    Allergies  Review of patient's allergies indicates no known allergies.  Triage Vitals: BP 194/90 mmHg  Pulse 106  Temp(Src) 98.4 F (36.9 C) (Oral)  Resp 18  Wt 199 lb (90.266 kg)  SpO2 100%  Physical Exam  Constitutional: He appears well-developed and well-nourished. No distress.  HENT:  Head: Normocephalic and atraumatic.  Right Ear: External ear normal.  Left Ear: External ear normal.  Eyes: Conjunctivae are normal. Right eye exhibits no discharge. Left eye exhibits no discharge. No scleral icterus.  Neck: Neck supple. No tracheal deviation present.  Cardiovascular: Normal rate, regular rhythm and  intact distal pulses.   Pulmonary/Chest: Effort normal and breath sounds normal. No stridor. No respiratory distress. He has no wheezes. He has no rales.  Abdominal: Soft. Bowel sounds are normal. He exhibits no distension. There is no tenderness. There is no rebound and no guarding.  Musculoskeletal: He exhibits no edema or tenderness.  Neurological: He is alert. He has normal strength. No cranial nerve deficit (no facial droop, extraocular movements intact, no slurred speech) or sensory deficit. He exhibits normal muscle tone. He displays no seizure activity. Coordination normal.  Skin: Skin is warm and dry. No rash noted.  Psychiatric: He has a normal mood and affect.  Nursing note and vitals reviewed.   ED Course  Procedures   DIAGNOSTIC STUDIES: Oxygen Saturation is 100% on RA, normal by my interpretation.    COORDINATION OF CARE: 2:25 AM Discussed treatment plan which includes CXR, EKG, and labs with pt at bedside and pt agreed to plan.  Labs Reviewed  BASIC METABOLIC PANEL - Abnormal; Notable for the following:    Glucose, Bld 186 (*)    BUN 21 (*)    All other components within normal limits  CBC  APTT  PROTIME-INR  I-STAT TROPOININ, ED  I-STAT TROPOININ, ED  I-STAT TROPOININ, ED    I, Dorie Rank, MD, personally reviewed and evaluated these images and lab results as part of my medical decision-making.  Imaging Review Dg Chest 2 View  07/14/2015   CLINICAL DATA:  Acute onset of nonradiating midline chest pain. Initial encounter.  EXAM: CHEST  2 VIEW  COMPARISON:  Chest radiograph from 05/19/2006, and CTA of the chest performed 06/11/2006  FINDINGS: The lungs are well-aerated and clear. There is no evidence of focal opacification, pleural effusion or pneumothorax.  The heart is normal in size; the mediastinal contour is within normal limits. No acute osseous abnormalities are seen.  IMPRESSION: No acute cardiopulmonary process seen.   Electronically Signed   By: Garald Balding  M.D.   On: 07/14/2015 02:34    EKG Interpretation  Date/Time:  Saturday July 14 2015 01:52:43 EDT Ventricular Rate:  104 PR Interval:  196 QRS Duration: 103 QT Interval:  340 QTC Calculation: 447 R Axis:   81 Text Interpretation:  Sinus tachycardia Borderline right axis deviation Baseline wander in lead(s) V1 V2 Since last tracing rate faster Confirmed by Aviraj Kentner  MD-J, Andreika Vandagriff ([id number]) on 07/14/2015 1:56:59 AM    MDM   Final diagnoses:  Chest pain, unspecified chest pain type   Pt has no known history of CAD.  Had a stress test the end of last year that was normal.  Pt has had chest pain like this in the past.  Sx may be related to GERD.  2 sets of enzymes  negative.  Dc home with antacids.  Follow up with his cardiologist.  Warning signs and precautions discussed.  I personally performed the services described in this documentation, which was scribed in my presence.  The recorded information has been reviewed and is accurate.    Dorie Rank, MD 07/14/15 [phone]

## 2015-07-14 NOTE — ED Notes (Signed)
Pt states he had chest pain last night and it was about same time and EMS was called to house but pt didn't come in ,  Tonight he was woke up from sleep with  Chest pressure, heaviness, minimal sob , denies nausea and vomiting,  Blood pressure is elevated and is normally,  Pt states same type pain 3 weeks ago too

## 2015-07-14 NOTE — Discharge Instructions (Signed)

## 2015-07-14 NOTE — ED Notes (Signed)
Patient transported to X-ray 

## 2015-07-14 NOTE — ED Notes (Signed)
Pt given warm blankets and lights dimmed,  His wife is at bedside, she was given recliner and warm blankets they are waiting for 3 hour troponin to be drawn, will rest in the meantime, unless this writer is needed,  Pt has call bell at bedside

## 2015-08-13 ENCOUNTER — Encounter: Payer: Self-pay | Admitting: Internal Medicine

## 2015-08-27 NOTE — Progress Notes (Signed)
Patient ID: Glenn Moore, male   DOB: Oct 10, 1946, 69 y.o.   MRN: [id number] Glenn Moore is seen today for f/U of SSCP, elevated lipids and HTN. His primary is [employer]. He had a normal cardac CTA in 2007 and a calcium score of 12. His diabetes is under borderline control.. I don't have and A1c but his sugars at home are running 130-140. He had his BS medicine adjusted an is running better. No dyspnea or palpitaitons. Golfing at [place] a lot. 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 There is no associated SOB, diaphoresis, palpitations or syncope. He is active and plays golf without problems  He does get occasional chest pain Seems more related to back and he gets a chiropractic adjustment occasionally. Pain is crampy/sharp with exertion and radiates to the chest anteriorly. Pain occurs couple times / month BP high in our office today but ok at home and with Dr Nyoka Cowden    Having some sharp left sided SSCP last few weeks  Golfing a lot  Pains last minutes  Can get them when he is resting  Golf does not exacerbate Compliante with meds   09/05/14  Reviewed ETT normal with no ischemia 07/14/15 Seen in ER [facility] for atypical chest pain r/o ? Anxiety  BP has been up beta blocker increased by Dr Nyoka Cowden  ROS: Denies fever, malais, weight loss, blurry vision, decreased visual acuity, cough, sputum, SOB, hemoptysis, pleuritic pain, palpitaitons, heartburn, abdominal pain, melena, lower extremity edema, claudication, or rash.  All other systems reviewed and negative  General: Affect appropriate Healthy:  appears stated age 75: normal Neck supple with no adenopathy JVP normal no bruits no thyromegaly Lungs clear with no wheezing and good diaphragmatic motion Heart:  S1/S2 no murmur, no rub, gallop or click PMI normal Abdomen: benighn, BS positve, no tenderness, no AAA no bruit.  No HSM or HJR Distal pulses intact with no bruits No edema Neuro non-focal Skin warm and dry No muscular  weakness   Current Outpatient Prescriptions  Medication Sig Dispense Refill  . acetaminophen (TYLENOL) 500 MG tablet Take 1,000 mg by mouth every 6 (six) hours as needed for moderate pain.    Marland Kitchen ALPRAZolam (XANAX) 0.5 MG tablet Take 0.5 mg by mouth at bedtime as needed for anxiety.    . celecoxib (CELEBREX) 200 MG capsule Take 200 mg by mouth daily.  0  . desoximetasone (TOPICORT) 0.25 % cream Apply 1 application topically 2 (two) times daily as needed (rash).    Marland Kitchen doxazosin (CARDURA) 4 MG tablet Take 4 mg by mouth daily.     Marland Kitchen glimepiride (AMARYL) 4 MG tablet 8 mg daily before breakfast.     . lisinopril (PRINIVIL,ZESTRIL) 20 MG tablet Take 20 mg by mouth daily.      . metFORMIN (GLUCOPHAGE) 850 MG tablet Take 850 mg by mouth 3 (three) times daily.     . metoprolol (TOPROL-XL) 50 MG 24 hr tablet Take 50 mg by mouth daily.     . naproxen sodium (ANAPROX) 220 MG tablet Take 220 mg by mouth 2 (two) times daily as needed (pain).    . pantoprazole (PROTONIX) 20 MG tablet Take 1 tablet (20 mg total) by mouth daily. 14 tablet 0  . PREVNAR 13 SUSP injection Inject as directed once.  0  . rosuvastatin (CRESTOR) 10 MG tablet Take 5 mg by mouth every other day.     . saxagliptin HCl (ONGLYZA) 5 MG TABS tablet Take 5 mg by mouth daily.  No current facility-administered medications for this visit.    Allergies  Review of patient's allergies indicates no known allergies.  Electrocardiogram: 9/14  SR rate 80 normal  08/2014  NSR normal ECG rate 67  Assessment and Plan  DM: Discussed low carb diet.  Target hemoglobin A1c is 6.5 or less.  Continue current medications. HTN: D/C lisinopril start Hyzaar 100/25  BMET in 3 weeks   Chol: labs with primary on statin  Chest Pain: ETT normal October 2015  BP control and anxiety control important. Started on celebrex by Dr  Nyoka Cowden ? costochondritis  F/U with me next available   Jenkins Rouge

## 2015-08-29 ENCOUNTER — Encounter: Payer: Self-pay | Admitting: Cardiovascular Disease

## 2015-08-29 ENCOUNTER — Ambulatory Visit (INDEPENDENT_AMBULATORY_CARE_PROVIDER_SITE_OTHER): Payer: Medicare Other | Admitting: Cardiovascular Disease

## 2015-08-29 VITALS — BP 170/100 | HR 74 | Ht 74.0 in | Wt 206.4 lb

## 2015-08-29 DIAGNOSIS — I10 Essential (primary) hypertension: Secondary | ICD-10-CM | POA: Diagnosis not present

## 2015-08-29 DIAGNOSIS — Z79899 Other long term (current) drug therapy: Secondary | ICD-10-CM

## 2015-08-29 MED ORDER — LOSARTAN POTASSIUM-HCTZ 100-25 MG PO TABS: 1.0000 | ORAL_TABLET | Freq: Every day | ORAL | Status: DC

## 2015-08-29 NOTE — Patient Instructions (Addendum)
Medication Instructions:   Your physician has recommended you make the following change in your medication: STOP LISINOPRIL  START  LOSARTAN/HCTZ   100 /25 MG  1  EVERY DAY  Labwork: BMET IN 3 WEEKS AT  DR  Rolly Salter  OFFICE  Testing/Procedures: NONE  Follow-Up: Your physician recommends that you schedule a follow-up appointment in: NEXT AVAILABLE WITH DR Johnsie Cancel  Any Other Special Instructions Will Be Listed Below (If Applicable).

## 2015-08-30 ENCOUNTER — Telehealth: Payer: Self-pay | Admitting: Cardiovascular Disease

## 2015-08-30 NOTE — Telephone Encounter (Signed)
LM TO CALL BACK ./CY 

## 2015-08-30 NOTE — Telephone Encounter (Signed)
New message     patient calling  In the office on 10.19.2016  need clarification On medication

## 2015-08-30 NOTE — Telephone Encounter (Signed)
ANSWERED PT'S  QUESTIONS  HAS  FOLLOW UP IN NEAR FUTURE ./CY

## 2015-09-19 ENCOUNTER — Other Ambulatory Visit: Payer: Medicare Other

## 2015-09-19 ENCOUNTER — Other Ambulatory Visit (INDEPENDENT_AMBULATORY_CARE_PROVIDER_SITE_OTHER): Payer: Medicare Other | Admitting: *Deleted

## 2015-09-19 DIAGNOSIS — E782 Mixed hyperlipidemia: Secondary | ICD-10-CM

## 2015-09-19 DIAGNOSIS — I10 Essential (primary) hypertension: Secondary | ICD-10-CM

## 2015-09-19 LAB — BASIC METABOLIC PANEL
BUN: 19 mg/dL (ref 7–25)
CHLORIDE: 99 mmol/L (ref 98–110)
CO2: 28 mmol/L (ref 20–31)
CREATININE: 0.99 mg/dL (ref 0.70–1.25)
Calcium: 9.2 mg/dL (ref 8.6–10.3)
GLUCOSE: 155 mg/dL — AB (ref 65–99)
Potassium: 3.9 mmol/L (ref 3.5–5.3)
Sodium: 137 mmol/L (ref 135–146)

## 2015-09-19 NOTE — Addendum Note (Signed)
Addended by: Eulis Foster on: 09/19/2015 09:32 AM   Modules accepted: Orders

## 2015-10-30 ENCOUNTER — Encounter: Payer: Self-pay | Admitting: Cardiovascular Disease

## 2015-10-30 ENCOUNTER — Ambulatory Visit (INDEPENDENT_AMBULATORY_CARE_PROVIDER_SITE_OTHER): Payer: Medicare Other | Admitting: Cardiovascular Disease

## 2015-10-30 VITALS — BP 144/68 | HR 74 | Ht 74.0 in | Wt 207.8 lb

## 2015-10-30 DIAGNOSIS — I10 Essential (primary) hypertension: Secondary | ICD-10-CM

## 2015-10-30 DIAGNOSIS — Z79899 Other long term (current) drug therapy: Secondary | ICD-10-CM | POA: Diagnosis not present

## 2015-10-30 NOTE — Progress Notes (Signed)
Patient ID: Glenn Moore, male   DOB: 10/20/1946, 69 y.o.   MRN: [id number]   Elizer is seen today for f/U of SSCP, elevated lipids and HTN. His primary is [employer]. He had a normal cardac CTA in 2007 and a calcium score of 12. His diabetes is under borderline control.. I don't have and A1c but his sugars at home are running 130-140. He had his BS medicine adjusted an is running better. No dyspnea or palpitaitons. Golfing at [employer] a lot. 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 There is no associated SOB, diaphoresis, palpitations or syncope. He is active and plays golf without problems  He does get occasional chest pain Seems more related to back and he gets a chiropractic adjustment occasionally. Pain is crampy/sharp with exertion and radiates to the chest anteriorly. Pain occurs couple times / month BP high in our office today but ok at home and with Dr Nyoka Cowden    Having some sharp left sided SSCP last few weeks  Golfing a lot  Pains last minutes  Can get them when he is resting  Golf does not exacerbate Compliante with meds   09/05/14  Reviewed ETT normal with no ischemia 07/14/15 Seen in ER [facility] for atypical chest pain r/o ? Anxiety  BP has been up beta blocker increased by Dr Nyoka Cowden  ROS: Denies fever, malais, weight loss, blurry vision, decreased visual acuity, cough, sputum, SOB, hemoptysis, pleuritic pain, palpitaitons, heartburn, abdominal pain, melena, lower extremity edema, claudication, or rash.  All other systems reviewed and negative  General: Affect appropriate Healthy:  appears stated age 21: normal Neck supple with no adenopathy JVP normal no bruits no thyromegaly Lungs clear with no wheezing and good diaphragmatic motion Heart:  S1/S2 no murmur, no rub, gallop or click PMI normal Abdomen: benighn, BS positve, no tenderness, no AAA no bruit.  No HSM or HJR Distal pulses intact with no bruits No edema Neuro non-focal Skin warm and dry No muscular  weakness   Current Outpatient Prescriptions  Medication Sig Dispense Refill  . acetaminophen (TYLENOL) 500 MG tablet Take 1,000 mg by mouth every 6 (six) hours as needed for moderate pain.    Marland Kitchen ALPRAZolam (XANAX) 0.5 MG tablet Take 0.5 mg by mouth at bedtime as needed for anxiety.    . celecoxib (CELEBREX) 200 MG capsule Take 200 mg by mouth daily.  0  . desoximetasone (TOPICORT) 0.25 % cream Apply 1 application topically 2 (two) times daily as needed (rash).    Marland Kitchen doxazosin (CARDURA) 4 MG tablet Take 4 mg by mouth daily.     Marland Kitchen glimepiride (AMARYL) 4 MG tablet Take 8 mg by mouth daily before breakfast.     . losartan-hydrochlorothiazide (HYZAAR) 100-25 MG tablet Take 1 tablet by mouth daily. 30 tablet 11  . metFORMIN (GLUCOPHAGE) 850 MG tablet Take 850 mg by mouth 3 (three) times daily.     . metoprolol (TOPROL-XL) 50 MG 24 hr tablet Take 50 mg by mouth daily.     . naproxen sodium (ANAPROX) 220 MG tablet Take 220 mg by mouth 2 (two) times daily as needed (pain).    . pantoprazole (PROTONIX) 20 MG tablet Take 1 tablet (20 mg total) by mouth daily. 14 tablet 0  . PREVNAR 13 SUSP injection Inject as directed once.  0  . rosuvastatin (CRESTOR) 10 MG tablet Take 5 mg by mouth every other day.     . saxagliptin HCl (ONGLYZA) 5 MG TABS tablet Take 5 mg by  mouth daily.     No current facility-administered medications for this visit.    Allergies  Review of patient's allergies indicates no known allergies.  Electrocardiogram: 9/14  SR rate 80 normal  08/2014  NSR normal ECG rate 67  Assessment and Plan  DM: Discussed low carb diet.  Target hemoglobin A1c is 6.5 or less.  Continue current medications. HTN: 08/30/15  Hyzaar 100/25 started and lisinopril stopped     Chol: labs with primary on statin  Chest Pain: ETT normal October 2015  BP control and anxiety control important. Started on celebrex by Dr  Nyoka Cowden ? costochondritis  F/U with me 6 months   Jenkins Rouge

## 2015-10-30 NOTE — Patient Instructions (Signed)

## 2016-08-20 ENCOUNTER — Encounter: Payer: Self-pay | Admitting: Cardiology

## 2016-08-20 NOTE — Progress Notes (Signed)
Cardiology Office Note   Date:  08/21/2016   ID:  Glenn Moore, DOB [birth year], MRN [id number]  PCP:  Criselda Peaches, MD  Cardiologist:  Dr. Johnsie Cancel    Chief Complaint  Patient presents with  . Hypertension    seen for Dr. Sherwood Gambler hypertension       History of Present Illness: Glenn Moore is a 70 y.o. male who presents for HTN follow up.    He had a normal cardac CTA in 2007 and a calcium score of 12. His diabetes is under borderline control.. I don't have and A1c was 7.5.No dyspnea or palpitaitons. No chest pain unless he is pulling something heavy he stops and it resolves.   Last stress test 08/2014 was normal .  He continues to Golf walks 9 holes and rides 9 holes.  There is no associated SOB, diaphoresis, palpitations or syncope. He is active.  He has Lt foot pain, believes it to be the tendon.  So his walking has slowed somewhat.     Dr. Nyoka Cowden follow his cholesterol.  He is on 10 mg Crestor every other day due to muscle aches when it is daily.  Eats healthy.    Past Medical History:  Diagnosis Date  . Chest pain   . DM (diabetes mellitus) ([place])   . HTN (hypertension)     Past Surgical History:  Procedure Laterality Date  . COLONOSCOPY    . PILONIDAL CYST EXCISION       Current Outpatient Prescriptions  Medication Sig Dispense Refill  . acetaminophen (TYLENOL) 500 MG tablet Take 1,000 mg by mouth every 6 (six) hours as needed for moderate pain.    Marland Kitchen ALPRAZolam (XANAX) 0.5 MG tablet Take 0.5 mg by mouth at bedtime as needed for anxiety.    . celecoxib (CELEBREX) 200 MG capsule Take 200 mg by mouth daily.  0  . desoximetasone (TOPICORT) 0.25 % cream Apply 1 application topically 2 (two) times daily as needed (rash).    Marland Kitchen doxazosin (CARDURA) 4 MG tablet Take 4 mg by mouth daily.     Marland Kitchen glimepiride (AMARYL) 4 MG tablet Take 8 mg by mouth daily before breakfast.     . losartan-hydrochlorothiazide (HYZAAR) 100-25 MG tablet Take 1 tablet by mouth  daily. 30 tablet 11  . metFORMIN (GLUCOPHAGE) 850 MG tablet Take 850 mg by mouth 3 (three) times daily.     . metoprolol (TOPROL-XL) 50 MG 24 hr tablet Take 50 mg by mouth daily.     . naproxen sodium (ANAPROX) 220 MG tablet Take 220 mg by mouth 2 (two) times daily as needed (pain).    . pantoprazole (PROTONIX) 20 MG tablet Take 1 tablet (20 mg total) by mouth daily. 14 tablet 0  . PREVNAR 13 SUSP injection Inject 0.5 mLs as directed once.   0  . rosuvastatin (CRESTOR) 10 MG tablet Take 10 mg by mouth every other day.     . saxagliptin HCl (ONGLYZA) 5 MG TABS tablet Take 5 mg by mouth daily.     No current facility-administered medications for this visit.     Allergies:   Review of patient's allergies indicates no known allergies.    Social History:  The patient  reports that he has never smoked. He has never used smokeless tobacco. He reports that he drinks about 2.4 oz of alcohol per week . He reports that he does not use drugs.   Family History:  The patient's family history includes Diabetes  in his mother; Heart disease in his father.    ROS:  General:no colds or fevers, no weight changes Skin:no rashes or ulcers HEENT:no blurred vision, no congestion CV:see HPI PUL:see HPI GI:no diarrhea constipation or melena, no indigestion GU:no hematuria, no dysuria MS:no joint pain, no claudication Neuro:no syncope, no lightheadedness Endo:+ diabetes, no thyroid disease  Wt Readings from Last 3 Encounters:  08/21/16 199 lb 12.8 oz (90.6 kg)  10/30/15 207 lb 12.8 oz (94.3 kg)  08/29/15 206 lb 6.4 oz (93.6 kg)     PHYSICAL EXAM: VS:  BP 128/80   Pulse 66   Ht 6\' 2"  (1.88 m)   Wt 199 lb 12.8 oz (90.6 kg)   SpO2 97%   BMI 25.65 kg/m  , BMI Body mass index is 25.65 kg/m. General:Pleasant affect, NAD Skin:Warm and dry, brisk capillary refill HEENT:normocephalic, sclera clear, mucus membranes moist Neck:supple, no JVD, no bruits  Heart:S1S2 RRR without murmur, gallup, rub or  click Lungs:clear without rales, rhonchi, or wheezes [id number], non tender, + BS, do not palpate liver spleen or masses Ext:no lower ext edema, 2+ pedal pulses, 2+ radial pulses Neuro:alert and oriented, MAE, follows commands, + facial symmetry    EKG:  EKG is ordered today. The ekg ordered today demonstrates SR normal EKG no changes from previous.     Recent Labs: 09/19/2015: BUN 19; Creat 0.99; Potassium 3.9; Sodium 137    Lipid Panel No results found for: CHOL, TRIG, HDL, CHOLHDL, VLDL, LDLCALC, LDLDIRECT     Other studies Reviewed: Additional studies/ records that were reviewed today include: ETT normal see above for other tests..   ASSESSMENT AND PLAN:  1.  HTN controlled continue medications  Follow up with Dr. Johnsie Cancel in 1 year.  2. occ chest pain, brief continues.  He does know to call if problems to be seen  3. Hyperlipidemia followed by Dr. Nyoka Cowden and on statin.  4. DM-2 followed by Dr. Nyoka Cowden.    Current medicines are reviewed with the patient today.  The patient Has no concerns regarding medicines.  The following changes have been made:  See above Labs/ tests ordered today include:see above  Disposition:   FU:  see above  Signed, Cecilie Kicks, NP  08/21/2016 9:53 AM    [facility] [place], [place], [place], [place] Phone: [phone]; Fax: [phone]

## 2016-08-21 ENCOUNTER — Encounter (INDEPENDENT_AMBULATORY_CARE_PROVIDER_SITE_OTHER): Payer: Self-pay

## 2016-08-21 ENCOUNTER — Encounter: Payer: Self-pay | Admitting: Cardiology

## 2016-08-21 ENCOUNTER — Ambulatory Visit (INDEPENDENT_AMBULATORY_CARE_PROVIDER_SITE_OTHER): Payer: Medicare Other | Admitting: Cardiology

## 2016-08-21 VITALS — BP 128/80 | HR 66 | Ht 74.0 in | Wt 199.8 lb

## 2016-08-21 DIAGNOSIS — I10 Essential (primary) hypertension: Secondary | ICD-10-CM

## 2016-08-21 DIAGNOSIS — R0789 Other chest pain: Secondary | ICD-10-CM

## 2016-08-21 NOTE — Patient Instructions (Signed)
Your physician has recommended that you continue your current therapy.  Follow-Up:  Your physician wants you to follow-up in: 1 year with Dr. Johnsie Cancel. You will receive a reminder letter in the mail two months in advance. If you don't receive a letter, please call our office to schedule the follow-up appointment.  If you need a refill on your cardiac medications before your next appointment, please call your pharmacy.

## 2016-09-02 ENCOUNTER — Other Ambulatory Visit: Payer: Self-pay

## 2016-09-02 MED ORDER — LOSARTAN POTASSIUM-HCTZ 100-25 MG PO TABS: 1.0000 | ORAL_TABLET | Freq: Every day | ORAL | 11 refills | Status: DC

## 2017-07-23 ENCOUNTER — Other Ambulatory Visit: Payer: Self-pay | Admitting: Cardiovascular Disease

## 2017-07-23 NOTE — Telephone Encounter (Signed)
Please keep upcoming appointment for further refills

## 2017-10-11 NOTE — Progress Notes (Signed)
Cardiology Office Note   Date:  10/13/2017   ID:  SHAFT CORIGLIANO, DOB 1945/12/16, MRN [id number]  PCP:  Levin Erp, MD  Cardiologist:  Dr. Johnsie Cancel    No chief complaint on file.     History of Present Illness: Glenn Moore is a 71 y.o. male who presents for HTN follow up.    He had a normal cardac CTA in 2007 and a calcium score of 12. A1c was 7.5.No dyspnea or palpitaitons. No chest pain unless he is pulling something heavy he stops and it resolves.   Last stress test 08/2014 was normal .  He continues to Golf walks 9 holes and rides 9 holes.  There is no associated SOB, diaphoresis, palpitations or syncope. He is active.     Dr. Nyoka Cowden follow his cholesterol.  He is on 10 mg Crestor every other day due to muscle aches when it is daily.  Eats healthy.    Past Medical History:  Diagnosis Date  . Chest pain   . DM (diabetes mellitus) ([place])   . HTN (hypertension)     Past Surgical History:  Procedure Laterality Date  . COLONOSCOPY    . PILONIDAL CYST EXCISION       Current Outpatient Medications  Medication Sig Dispense Refill  . acetaminophen (TYLENOL) 500 MG tablet Take 1,000 mg by mouth every 6 (six) hours as needed for moderate pain.    Marland Kitchen ALPRAZolam (XANAX) 0.5 MG tablet Take 0.5 mg by mouth at bedtime as needed for anxiety.    Marland Kitchen desoximetasone (TOPICORT) 0.25 % cream Apply 1 application topically 2 (two) times daily as needed (rash).    Marland Kitchen doxazosin (CARDURA) 4 MG tablet Take 4 mg by mouth daily.     Marland Kitchen glimepiride (AMARYL) 4 MG tablet Take 8 mg by mouth daily before breakfast.     . Insulin Glargine (TOUJEO SOLOSTAR [place]) Inject 20 Units into the skin daily.    Marland Kitchen losartan-hydrochlorothiazide (HYZAAR) 100-25 MG tablet Take 1 tablet by mouth daily. 90 tablet 3  . metFORMIN (GLUCOPHAGE) 850 MG tablet Take 850 mg by mouth 3 (three) times daily.     . metoprolol (TOPROL-XL) 50 MG 24 hr tablet Take 50 mg by mouth daily.     . naproxen sodium (ANAPROX) 220 MG tablet  Take 220 mg by mouth 2 (two) times daily as needed (pain).    Marland Kitchen PREVNAR 13 SUSP injection Inject 0.5 mLs as directed once.   0  . rosuvastatin (CRESTOR) 10 MG tablet Take 10 mg by mouth every other day.     . saxagliptin HCl (ONGLYZA) 5 MG TABS tablet Take 5 mg by mouth daily.     No current facility-administered medications for this visit.     Allergies:   Patient has no known allergies.    Social History:  The patient  reports that  has never smoked. he has never used smokeless tobacco. He reports that he drinks about 2.4 oz of alcohol per week. He reports that he does not use drugs.   Family History:  The patient's family history includes Diabetes in his mother; Heart disease in his father and unknown relative.    ROS:  General:no colds or fevers, no weight changes Skin:no rashes or ulcers HEENT:no blurred vision, no congestion CV:see HPI PUL:see HPI GI:no diarrhea constipation or melena, no indigestion GU:no hematuria, no dysuria MS:no joint pain, no claudication Neuro:no syncope, no lightheadedness Endo:+ diabetes, no thyroid disease  Wt Readings from Last  3 Encounters:  10/13/17 204 lb 4 oz (92.6 kg)  08/21/16 199 lb 12.8 oz (90.6 kg)  10/30/15 207 lb 12.8 oz (94.3 kg)     PHYSICAL EXAM: VS:  BP (!) 156/86   Pulse 78   Ht 6\' 2"  (1.88 m)   Wt 204 lb 4 oz (92.6 kg)   SpO2 99%   BMI 26.22 kg/m  , BMI Body mass index is 26.22 kg/m. General:Pleasant affect, NAD Skin:Warm and dry, brisk capillary refill HEENT:normocephalic, sclera clear, mucus membranes moist Neck:supple, no JVD, no bruits  Heart:S1S2 RRR without murmur, gallup, rub or click Lungs:clear without rales, rhonchi, or wheezes HRC:BULA, non tender, + BS, do not palpate liver spleen or masses Ext:no lower ext edema, 2+ pedal pulses, 2+ radial pulses Neuro:alert and oriented, MAE, follows commands, + facial symmetry    EKG:   SR normal EKG no changes from previous.  10/13/17 SR rate 70 normal    Recent  Labs: No results found for requested labs within last 8760 hours.    Lipid Panel No results found for: CHOL, TRIG, HDL, CHOLHDL, VLDL, LDLCALC, LDLDIRECT     Other studies Reviewed: Additional studies/ records that were reviewed today include: ETT normal see above for other tests..   ASSESSMENT AND PLAN:  1.  HTN Well controlled.  Continue current medications and low sodium Dash type diet.  Home Readings and at primary normal   2. CAD:   2007 calcium score 12 no obstructive disease myovue 2015 normal will consider ETT And calcium score in a year   3. Hyperlipidemia followed by Dr. Nyoka Cowden and on statin.  4. DM-2 Discussed low carb diet.  Target hemoglobin A1c is 6.5 or less.  Continue current medications.    Current medicines are reviewed with the patient today.  The patient Has no concerns regarding medicines.  The following changes have been made:  See above Labs/ tests ordered today include:see above  Disposition:   FU:  see above  Signed, Jenkins Rouge, MD  10/13/2017 12:05 PM    [facility] [place], [place], [place], [place] Phone: [phone]; Fax: [phone]

## 2017-10-13 ENCOUNTER — Ambulatory Visit (INDEPENDENT_AMBULATORY_CARE_PROVIDER_SITE_OTHER): Payer: Medicare Other | Admitting: Cardiovascular Disease

## 2017-10-13 ENCOUNTER — Encounter: Payer: Self-pay | Admitting: Cardiovascular Disease

## 2017-10-13 VITALS — BP 156/86 | HR 78 | Ht 74.0 in | Wt 204.2 lb

## 2017-10-13 DIAGNOSIS — I10 Essential (primary) hypertension: Secondary | ICD-10-CM | POA: Diagnosis not present

## 2017-10-13 DIAGNOSIS — E785 Hyperlipidemia, unspecified: Secondary | ICD-10-CM | POA: Diagnosis not present

## 2017-10-13 DIAGNOSIS — I2510 Atherosclerotic heart disease of native coronary artery without angina pectoris: Secondary | ICD-10-CM

## 2017-10-13 MED ORDER — LOSARTAN POTASSIUM-HCTZ 100-25 MG PO TABS: 1.0000 | ORAL_TABLET | Freq: Every day | ORAL | 3 refills | Status: DC

## 2017-10-13 NOTE — Patient Instructions (Addendum)

## 2018-01-26 ENCOUNTER — Telehealth: Payer: Self-pay | Admitting: Cardiovascular Disease

## 2018-01-26 MED ORDER — TELMISARTAN-HCTZ 80-25 MG PO TABS: 1.0000 | ORAL_TABLET | Freq: Every day | ORAL | 11 refills | Status: DC

## 2018-01-26 NOTE — Telephone Encounter (Signed)
Pt previously with recalls would recommend to change to telmisartan HCT 80/25mg  daily. Monitor pressures and call with changes.

## 2018-01-26 NOTE — Telephone Encounter (Signed)
Called patient with pharmacy's recommendation. Patient verbalized understanding and he will monitor his BP and call with any changes.

## 2018-01-26 NOTE — Telephone Encounter (Signed)
Pt c/o medication issue:  1. Name of Medication: Losartan   2. How are you currently taking this medication (dosage and times per day)? 100-25 mg// 1x daily  3. Are you having a reaction (difficulty breathing--STAT)? no  4. What is your medication issue? Medication being recalled and would like to know if he needs an alternative or what steps to take next?

## 2018-02-10 MED ORDER — TELMISARTAN-HCTZ 80-25 MG PO TABS: 1.0000 | ORAL_TABLET | Freq: Every day | ORAL | 11 refills | Status: DC

## 2018-02-10 NOTE — Addendum Note (Signed)
Addended by: Erskine Emery on: 02/10/2018 11:56 AM   Modules accepted: Orders

## 2018-10-12 NOTE — Progress Notes (Signed)
Cardiology Office Note   Date:  10/13/2018   ID:  Glenn Moore, DOB 1946/06/25, MRN [id number]  PCP:  Levin Erp, MD  Cardiologist:  Dr. Johnsie Cancel    No chief complaint on file.     History of Present Illness: Glenn Moore is a 72 y.o. male who presents for HTN follow up.    He had a normal cardac CTA in 2007 and a calcium score of 12. A1c was 7.5.No dyspnea or palpitaitons. No chest pain unless he is pulling something heavy he stops and it resolves.   Last stress test 08/2014 was normal .  He continues to Golf walks 9 holes and rides 9 holes.  There is no associated SOB, diaphoresis, palpitations or syncope. He is active.     Dr. Nyoka Cowden follow his cholesterol.  He is on 10 mg Crestor every other day due to muscle aches when it is daily.  Eats healthy.    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 Chest pain   . DM (diabetes mellitus) ([place])   . HTN (hypertension)     Past Surgical History:  Procedure Laterality Date  . COLONOSCOPY    . PILONIDAL CYST EXCISION       Current Outpatient Medications  Medication Sig Dispense Refill  . acetaminophen (TYLENOL) 500 MG tablet Take 1,000 mg by mouth every 6 (six) hours as needed for moderate pain.    Marland Kitchen ALPRAZolam (XANAX) 0.5 MG tablet Take 0.5 mg by mouth at bedtime as needed for anxiety.    Marland Kitchen desoximetasone (TOPICORT) 0.25 % cream Apply 1 application topically 2 (two) times daily as needed (rash).    Marland Kitchen doxazosin (CARDURA) 4 MG tablet Take 4 mg by mouth daily.     Marland Kitchen glimepiride (AMARYL) 4 MG tablet Take 8 mg by mouth daily before breakfast.     . Insulin Glargine (TOUJEO SOLOSTAR [place]) Inject 20 Units into the skin daily.    . metFORMIN (GLUCOPHAGE) 850 MG tablet Take 850 mg by mouth 3 (three) times daily.     . metoprolol (TOPROL-XL) 50 MG 24 hr tablet Take 50 mg by mouth daily.     . naproxen  sodium (ANAPROX) 220 MG tablet Take 220 mg by mouth 2 (two) times daily as needed (pain).    Marland Kitchen PREVNAR 13 SUSP injection Inject 0.5 mLs as directed once.   0  . rosuvastatin (CRESTOR) 10 MG tablet Take 10 mg by mouth every other day.     . saxagliptin HCl (ONGLYZA) 5 MG TABS tablet Take 5 mg by mouth daily.    Marland Kitchen telmisartan-hydrochlorothiazide (MICARDIS HCT) 80-25 MG tablet Take 1 tablet by mouth daily. 30 tablet 11  . TOUJEO SOLOSTAR 300 UNIT/ML SOPN Inject 20 Units into the skin daily.  0   No current facility-administered medications for this visit.     Allergies:   Patient has no known allergies.    Social History:  The patient  reports that he has never smoked. He has never used smokeless tobacco. He reports that he drinks about 4.0 standard drinks of alcohol per week. He reports that he does not use drugs.   Family History:  The patient's family history includes Diabetes in his mother; Heart disease in his father and unknown relative.    ROS:  General:no colds or fevers, no weight changes  Skin:no rashes or ulcers HEENT:no blurred vision, no congestion CV:see HPI PUL:see HPI GI:no diarrhea constipation or melena, no indigestion GU:no hematuria, no dysuria MS:no joint pain, no claudication Neuro:no syncope, no lightheadedness Endo:+ diabetes, no thyroid disease  Wt Readings from Last 3 Encounters:  10/13/18 207 lb (93.9 kg)  10/13/17 204 lb 4 oz (92.6 kg)  08/21/16 199 lb 12.8 oz (90.6 kg)     PHYSICAL EXAM: VS:  BP (!) 150/78   Pulse 66   Ht 6\' 2"  (1.88 m)   Wt 207 lb (93.9 kg)   BMI 26.58 kg/m  , BMI Body mass index is 26.58 kg/m. General:Pleasant affect, NAD Skin:Warm and dry, brisk capillary refill HEENT:normocephalic, sclera clear, mucus membranes moist Neck:supple, no JVD, no bruits  Heart:S1S2 RRR without murmur, gallup, rub or click Lungs:clear without rales, rhonchi, or wheezes GLO:VFIE, non tender, + BS, do not palpate liver spleen or masses Ext:no  lower ext edema, 2+ pedal pulses, 2+ radial pulses   EKG:   10/12/18 SR rate 66 new LBBB  10/13/17 SR rate 70 normal    Recent Labs: No results found for requested labs within last 8760 hours.    Lipid Panel No results found for: CHOL, TRIG, HDL, CHOLHDL, VLDL, LDLCALC, LDLDIRECT     Other studies Reviewed: Additional studies/ records that were reviewed today include: ETT normal see above for other tests..   ASSESSMENT AND PLAN:  1.  HTN Well controlled.  Continue current medications and low sodium Dash type diet.  Home Readings and at primary normal   2. CAD:   2007 calcium score 12 no obstructive disease myovue 2015 normal will consider F/U ETT and calcium score   3. Hyperlipidemia followed by Dr. Nyoka Cowden and on statin.  4. DM-2 Discussed low carb diet.  Target hemoglobin A1c is 6.5 or less.  Continue current medications.  5. LBBB:  New given risk factors and DM will order lexiscan myovue and TTE to r/o new structural heart disease   Current medicines are reviewed with the patient today.  The patient Has no concerns regarding medicines.  The following changes have been made:  None  Labs/ tests ordered today include:ETT and Calcium Score   Disposition:   FU:  In a year   Signed, Jenkins Rouge, MD  10/13/2018 11:53 AM    [facility] [place], [place], [place] Phone: [phone]; Fax: [phone]

## 2018-10-13 ENCOUNTER — Ambulatory Visit (INDEPENDENT_AMBULATORY_CARE_PROVIDER_SITE_OTHER): Payer: Medicare Other | Admitting: Cardiovascular Disease

## 2018-10-13 VITALS — BP 150/78 | HR 66 | Ht 74.0 in | Wt 207.0 lb

## 2018-10-13 DIAGNOSIS — I447 Left bundle-branch block, unspecified: Secondary | ICD-10-CM | POA: Diagnosis not present

## 2018-10-13 DIAGNOSIS — E785 Hyperlipidemia, unspecified: Secondary | ICD-10-CM

## 2018-10-13 DIAGNOSIS — I10 Essential (primary) hypertension: Secondary | ICD-10-CM

## 2018-10-13 DIAGNOSIS — I2510 Atherosclerotic heart disease of native coronary artery without angina pectoris: Secondary | ICD-10-CM

## 2018-10-13 NOTE — Patient Instructions (Addendum)
Medication Instructions:   If you need a refill on your cardiac medications before your next appointment, please call your pharmacy.   Lab work:  If you have labs (blood work) drawn today and your tests are completely normal, you will receive your results only by: Marland Kitchen MyChart Message (if you have MyChart) OR . A paper copy in the mail If you have any lab test that is abnormal or we need to change your treatment, we will call you to review the results.  Testing/Procedures: Your physician has requested that you have a lexiscan myoview. For further information please visit [email or web address]. Please follow instruction sheet, as given.  Cardiac CT scanning for Calcium score, (CAT scanning), is a noninvasive, special x-ray that produces cross-sectional images of the body using x-rays and a computer. CT scans help physicians diagnose and treat medical conditions. For some CT exams, a contrast material is used to enhance visibility in the area of the body being studied. CT scans provide greater clarity and reveal more details than regular x-ray exams.  Follow-Up: At [facility], you and your health needs are our priority.  As part of our continuing mission to provide you with exceptional heart care, we have created designated Provider Care Teams.  These Care Teams include your primary Cardiologist (physician) and Advanced Practice Providers (APPs -  Physician Assistants and Nurse Practitioners) who all work together to provide you with the care you need, when you need it. You will need a follow up appointment in 1 years.  Please call our office 2 months in advance to schedule this appointment.  You may see Jenkins Rouge, MD or one of the following Advanced Practice Providers on your designated Care Team:   Truitt Merle, NP Cecilie Kicks, NP . Kathyrn Drown, NP

## 2018-10-19 ENCOUNTER — Encounter (HOSPITAL_COMMUNITY): Payer: Self-pay | Admitting: *Deleted

## 2018-10-19 ENCOUNTER — Telehealth (HOSPITAL_COMMUNITY): Payer: Self-pay | Admitting: *Deleted

## 2018-10-19 NOTE — Telephone Encounter (Signed)
Patient given detailed instructions per Myocardial Perfusion Study Information Sheet for the test on 10/25/18 at 0730. Patient notified to arrive 15 minutes early and that it is imperative to arrive on time for appointment to keep from having the test rescheduled.  If you need to cancel or reschedule your appointment, please call the office within 24 hours of your appointment. . Patient verbalized understanding.Glenn Moore, Ranae Palms

## 2018-10-25 ENCOUNTER — Ambulatory Visit (INDEPENDENT_AMBULATORY_CARE_PROVIDER_SITE_OTHER)
Admission: RE | Admit: 2018-10-25 | Discharge: 2018-10-25 | Disposition: A | Discharge status: Home / Self Care | Payer: Medicare Other | Source: Ambulatory Visit | Attending: Cardiovascular Disease | Admitting: Cardiovascular Disease

## 2018-10-25 ENCOUNTER — Ambulatory Visit (HOSPITAL_COMMUNITY): Payer: Medicare Other | Attending: Cardiology

## 2018-10-25 DIAGNOSIS — I10 Essential (primary) hypertension: Secondary | ICD-10-CM | POA: Diagnosis not present

## 2018-10-25 DIAGNOSIS — I2510 Atherosclerotic heart disease of native coronary artery without angina pectoris: Secondary | ICD-10-CM | POA: Insufficient documentation

## 2018-10-25 DIAGNOSIS — I447 Left bundle-branch block, unspecified: Secondary | ICD-10-CM | POA: Insufficient documentation

## 2018-10-25 DIAGNOSIS — E785 Hyperlipidemia, unspecified: Secondary | ICD-10-CM

## 2018-10-25 LAB — MYOCARDIAL PERFUSION IMAGING
CSEPPHR: 94 {beats}/min
LV dias vol: 119 mL (ref 62–150)
LV sys vol: 45 mL
Rest HR: 66 {beats}/min
SDS: 2
SRS: 0
SSS: 2
TID: 1.06

## 2018-10-25 MED ORDER — TECHNETIUM TC 99M TETROFOSMIN IV KIT
31.6000 | PACK | Freq: Once | INTRAVENOUS | Status: AC | PRN
  Administered 2018-10-25: 31.6 via INTRAVENOUS
  Filled 2018-10-25: qty 32

## 2018-10-25 MED ORDER — TECHNETIUM TC 99M TETROFOSMIN IV KIT
9.8000 | PACK | Freq: Once | INTRAVENOUS | Status: AC | PRN
  Administered 2018-10-25: 9.8 via INTRAVENOUS
  Filled 2018-10-25: qty 10

## 2018-10-25 MED ORDER — REGADENOSON 0.4 MG/5ML IV SOLN
0.4000 mg | Freq: Once | INTRAVENOUS | Status: AC
  Administered 2018-10-25: 0.4 mg via INTRAVENOUS

## 2018-10-26 ENCOUNTER — Telehealth: Payer: Self-pay

## 2018-10-26 NOTE — Telephone Encounter (Signed)
Called pt to give him his results of his stress test and Cardiac Ct score.. he reports that his PMD Dr. Zada Girt checks his Lipids every year at his physical... he declines coming in here to have them drawn even though I explained the importance of good control but he says he is seeing Dr. Nyoka Cowden this month and will be sure that Dr.  Johnsie Cancel gets a copy of his results for review.

## 2018-10-26 NOTE — Telephone Encounter (Signed)
-----   Message from Josue Hector, MD sent at 10/25/2018  2:20 PM EST ----- Calcium score much higher than 2007 continue ASA and statin will see what stress test shows ? Recent lipids done ?

## 2019-11-11 HISTORY — PX: CATARACT EXTRACTION: SUR2

## 2019-11-21 DIAGNOSIS — H2512 Age-related nuclear cataract, left eye: Secondary | ICD-10-CM | POA: Diagnosis not present

## 2019-11-22 DIAGNOSIS — H25011 Cortical age-related cataract, right eye: Secondary | ICD-10-CM | POA: Diagnosis not present

## 2019-11-22 DIAGNOSIS — H25041 Posterior subcapsular polar age-related cataract, right eye: Secondary | ICD-10-CM | POA: Diagnosis not present

## 2019-11-22 DIAGNOSIS — H2511 Age-related nuclear cataract, right eye: Secondary | ICD-10-CM | POA: Diagnosis not present

## 2019-11-28 DIAGNOSIS — Z03818 Encounter for observation for suspected exposure to other biological agents ruled out: Secondary | ICD-10-CM | POA: Diagnosis not present

## 2019-11-28 DIAGNOSIS — Z20828 Contact with and (suspected) exposure to other viral communicable diseases: Secondary | ICD-10-CM | POA: Diagnosis not present

## 2019-11-30 ENCOUNTER — Ambulatory Visit: Payer: Medicare Other | Attending: Internal Medicine

## 2019-11-30 DIAGNOSIS — Z23 Encounter for immunization: Secondary | ICD-10-CM | POA: Insufficient documentation

## 2019-11-30 NOTE — Progress Notes (Signed)
   Covid-19 Vaccination Clinic  Name:  Glenn Moore    MRN: [id number] DOB: [birth year]  11/30/2019  Glenn Moore was observed post Covid-19 immunization for 15 minutes without incidence. He was provided with Vaccine Information Sheet and instruction to access the V-Safe system.   Glenn Moore was instructed to call 911 with any severe reactions post vaccine: Marland Kitchen Difficulty breathing  . Swelling of your face and throat  . A fast heartbeat  . A bad rash all over your body  . Dizziness and weakness    Immunizations Administered    Name Date Dose VIS Date Route   Pfizer COVID-19 Vaccine 11/30/2019 11:33 AM 0.3 mL 10/21/2019 Intramuscular   Manufacturer: [place]   Lot: [id number]   [place]: [id number]

## 2019-12-01 ENCOUNTER — Ambulatory Visit: Payer: Medicare Other | Admitting: Cardiovascular Disease

## 2019-12-12 DIAGNOSIS — H2511 Age-related nuclear cataract, right eye: Secondary | ICD-10-CM | POA: Diagnosis not present

## 2019-12-19 ENCOUNTER — Ambulatory Visit: Payer: Medicare Other | Attending: Internal Medicine

## 2019-12-19 DIAGNOSIS — Z23 Encounter for immunization: Secondary | ICD-10-CM | POA: Insufficient documentation

## 2019-12-19 NOTE — Progress Notes (Signed)
   Covid-19 Vaccination Clinic  Name:  OREL WHITMOYER    MRN: [id number] DOB: [birth year]  12/19/2019  Mr. Sansom was observed post Covid-19 immunization for 15 minutes without incidence. He was provided with Vaccine Information Sheet and instruction to access the V-Safe system.   Mr. Mcaden was instructed to call 911 with any severe reactions post vaccine: Marland Kitchen Difficulty breathing  . Swelling of your face and throat  . A fast heartbeat  . A bad rash all over your body  . Dizziness and weakness    Immunizations Administered    Name Date Dose VIS Date Route   Pfizer COVID-19 Vaccine 12/19/2019  5:33 PM 0.3 mL 10/21/2019 Intramuscular   Manufacturer: [place]   Lot: [id number]   [place]: [id number]

## 2019-12-26 ENCOUNTER — Ambulatory Visit: Payer: Medicare Other

## 2020-01-18 NOTE — Progress Notes (Signed)
Cardiology Office Note   Date:  01/31/2020   ID:  Glenn Moore, DOB Dec 19, 1945, MRN [id number]  PCP:  Sueanne Margarita, DO  Cardiologist:  Dr. Johnsie Cancel    No chief complaint on file.     History of Present Illness: Glenn Moore is a 74 y.o. male who presents for HTN follow up.    He had a normal cardac CTA in 2007 and a calcium score of 12. A1c was 7.5.No dyspnea or palpitaitons. No chest pain unless he is pulling something heavy he stops and it resolves.   Last stress test 08/2014 was normal .  He continues to Golf walks 9 holes and rides 9 holes.  There is no associated SOB, diaphoresis, palpitations or syncope. He is active.     Dr. Nyoka Cowden follow his cholesterol.  He is on 10 mg Crestor every other day due to muscle aches when it is daily.  Eats healthy.  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 Chest pain   . DM (diabetes mellitus) ([place])   . HTN (hypertension)     Past Surgical History:  Procedure Laterality Date  . COLONOSCOPY    . PILONIDAL CYST EXCISION       Current Outpatient Medications  Medication Sig Dispense Refill  . acetaminophen (TYLENOL) 500 MG tablet Take 1,000 mg by mouth every 6 (six) hours as needed for moderate pain.    Marland Kitchen ALPRAZolam (XANAX) 0.5 MG tablet Take 0.5 mg by mouth at bedtime as needed for anxiety.    . chlorzoxazone (PARAFON) 500 MG tablet Take 500 mg by mouth daily.    Marland Kitchen desoximetasone (TOPICORT) 0.25 %  cream Apply 1 application topically 2 (two) times daily as needed (rash).    Marland Kitchen doxazosin (CARDURA) 4 MG tablet Take 4 mg by mouth daily.     Marland Kitchen glimepiride (AMARYL) 4 MG tablet Take 8 mg by mouth daily before breakfast.     . Insulin Glargine (TOUJEO SOLOSTAR [place]) Inject 20 Units into the skin daily.    Marland Kitchen JARDIANCE 25 MG TABS tablet Take 25 mg by mouth daily.    Marland Kitchen losartan (COZAAR) 100 MG tablet Take 100 mg by mouth daily.    . metFORMIN (GLUCOPHAGE) 850 MG tablet Take 850 mg by mouth 3 (three) times daily.     . metoprolol (TOPROL-XL) 50 MG 24 hr tablet Take 50 mg by mouth daily.     . naproxen sodium (ANAPROX) 220 MG tablet Take 220 mg by mouth 2 (two) times daily as needed (pain).    . rosuvastatin (CRESTOR) 5 MG tablet Take 5 mg by mouth every other day.    Nelva Nay SOLOSTAR 300 UNIT/ML SOPN Inject 20 Units into the skin daily.  0   No current facility-administered medications for this visit.    Allergies:   Patient has no known allergies.  Social History:  The patient  reports that he has never smoked. He has never used smokeless tobacco. He reports current alcohol use of about 4.0 standard drinks of alcohol per week. He reports that he does not use drugs.   Family History:  The patient's family history includes Diabetes in his mother; Heart disease in his father and unknown relative.    ROS:  General:no colds or fevers, no weight changes Skin:no rashes or ulcers HEENT:no blurred vision, no congestion CV:see HPI PUL:see HPI GI:no diarrhea constipation or melena, no indigestion GU:no hematuria, no dysuria MS:no joint pain, no claudication Neuro:no syncope, no lightheadedness Endo:+ diabetes, no thyroid disease  Wt Readings from Last 3 Encounters:  01/31/20 200 lb (90.7 kg)  10/25/18 207 lb (93.9 kg)  10/13/18 207 lb (93.9 kg)     PHYSICAL EXAM: VS:  BP (!) 160/98   Pulse 65   Ht 6\' 2"  (1.88 m)   Wt 200 lb (90.7 kg)   SpO2 98%   BMI 25.68 kg/m  , BMI Body mass index  is 25.68 kg/m. General:Pleasant affect, NAD Skin:Warm and dry, brisk capillary refill HEENT:normocephalic, sclera clear, mucus membranes moist Neck:supple, no JVD, no bruits  Heart:S1S2 RRR without murmur, gallup, rub or click Lungs:clear without rales, rhonchi, or wheezes [id number], non tender, + BS, do not palpate liver spleen or masses Ext:no lower ext edema, 2+ pedal pulses, 2+ radial pulses   EKG:   10/12/18 SR rate 66 new LBBB  10/13/17 SR rate 70 normal    Recent Labs: No results found for requested labs within last 8760 hours.    Lipid Panel No results found for: CHOL, TRIG, HDL, CHOLHDL, VLDL, LDLCALC, LDLDIRECT     Other studies Reviewed: Additional studies/ records that were reviewed today include: ETT normal see above for other tests..   ASSESSMENT AND PLAN:  1.  HTN seems borderline controlled primary d/c HCTZ when changing DM meds as he felt glucosyria would increase. Will leave adjustments up to him  2. CAD:   High calcium score for age Normal myovue  10/25/18 active with no angina continue beta blocker asa and statin   3. Hyperlipidemia followed by Dr. Nyoka Cowden and on statin. However LDL above goal of 70 when taking crestor 10 mg qod Apparently has been taking 20 mg crestor daily and had labs with Dr Vivi Martens last week will review   4. DM-2 Discussed low carb diet.  Target hemoglobin A1c is 6.5 or less.  Continue current medications.  5. LBBB:  New 2019 yearly ECG no evidence of high grade heart block    Current medicines are reviewed with the patient today.  The patient Has no concerns regarding medicines.  The following changes have been made:  None  Labs/ tests ordered today include:None   Disposition:   FU:  In a year   Signed, Jenkins Rouge, MD  01/31/2020 3:57 PM    [facility] [place], [place], [place] Phone: [phone]; Fax: [phone]

## 2020-01-25 DIAGNOSIS — I25118 Atherosclerotic heart disease of native coronary artery with other forms of angina pectoris: Secondary | ICD-10-CM | POA: Diagnosis not present

## 2020-01-25 DIAGNOSIS — N1831 Chronic kidney disease, stage 3a: Secondary | ICD-10-CM | POA: Diagnosis not present

## 2020-01-25 DIAGNOSIS — E7849 Other hyperlipidemia: Secondary | ICD-10-CM | POA: Diagnosis not present

## 2020-01-25 DIAGNOSIS — I209 Angina pectoris, unspecified: Secondary | ICD-10-CM | POA: Diagnosis not present

## 2020-01-31 ENCOUNTER — Ambulatory Visit (INDEPENDENT_AMBULATORY_CARE_PROVIDER_SITE_OTHER): Payer: Medicare Other | Admitting: Cardiovascular Disease

## 2020-01-31 ENCOUNTER — Other Ambulatory Visit: Payer: Self-pay

## 2020-01-31 ENCOUNTER — Encounter: Payer: Self-pay | Admitting: Cardiovascular Disease

## 2020-01-31 VITALS — BP 160/98 | HR 65 | Ht 74.0 in | Wt 200.0 lb

## 2020-01-31 DIAGNOSIS — I2510 Atherosclerotic heart disease of native coronary artery without angina pectoris: Secondary | ICD-10-CM

## 2020-01-31 NOTE — Patient Instructions (Addendum)
Medication Instructions:  *If you need a refill on your cardiac medications before your next appointment, please call your pharmacy*  Lab Work: If you have labs (blood work) drawn today and your tests are completely normal, you will receive your results only by: . MyChart Message (if you have MyChart) OR . A paper copy in the mail If you have any lab test that is abnormal or we need to change your treatment, we will call you to review the results.  Follow-Up: At [facility], you and your health needs are our priority.  As part of our continuing mission to provide you with exceptional heart care, we have created designated Provider Care Teams.  These Care Teams include your primary Cardiologist (physician) and Advanced Practice Providers (APPs -  Physician Assistants and Nurse Practitioners) who all work together to provide you with the care you need, when you need it.  We recommend signing up for the patient portal called "MyChart".  Sign up information is provided on this After Visit Summary.  MyChart is used to connect with patients for Virtual Visits (Telemedicine).  Patients are able to view lab/test results, encounter notes, upcoming appointments, etc.  Non-urgent messages can be sent to your provider as well.   To learn more about what you can do with MyChart, go to [email or web address].    Your next appointment:   12 month(s)  The format for your next appointment:   In Person  Provider:   You may see Peter Nishan, MD or one of the following Advanced Practice Providers on your designated Care Team:    Lori Gerhardt, NP  Laura Ingold, NP  Jill McDaniel, NP     

## 2020-02-01 NOTE — Addendum Note (Signed)
Addended by: Jacinta Shoe on: 02/01/2020 01:17 PM   Modules accepted: Orders

## 2020-03-20 DIAGNOSIS — I25118 Atherosclerotic heart disease of native coronary artery with other forms of angina pectoris: Secondary | ICD-10-CM | POA: Diagnosis not present

## 2020-03-20 DIAGNOSIS — N1831 Chronic kidney disease, stage 3a: Secondary | ICD-10-CM | POA: Diagnosis not present

## 2020-03-20 DIAGNOSIS — E7849 Other hyperlipidemia: Secondary | ICD-10-CM | POA: Diagnosis not present

## 2020-03-20 DIAGNOSIS — I209 Angina pectoris, unspecified: Secondary | ICD-10-CM | POA: Diagnosis not present

## 2020-07-03 DIAGNOSIS — N521 Erectile dysfunction due to diseases classified elsewhere: Secondary | ICD-10-CM | POA: Diagnosis not present

## 2020-07-03 DIAGNOSIS — E1169 Type 2 diabetes mellitus with other specified complication: Secondary | ICD-10-CM | POA: Diagnosis not present

## 2020-07-03 DIAGNOSIS — B3742 Candidal balanitis: Secondary | ICD-10-CM | POA: Diagnosis not present

## 2020-07-17 DIAGNOSIS — E119 Type 2 diabetes mellitus without complications: Secondary | ICD-10-CM | POA: Diagnosis not present

## 2020-07-17 DIAGNOSIS — Z961 Presence of intraocular lens: Secondary | ICD-10-CM | POA: Diagnosis not present

## 2020-07-17 DIAGNOSIS — H02831 Dermatochalasis of right upper eyelid: Secondary | ICD-10-CM | POA: Diagnosis not present

## 2020-07-17 DIAGNOSIS — H11153 Pinguecula, bilateral: Secondary | ICD-10-CM | POA: Diagnosis not present

## 2020-07-23 IMAGING — CT CT HEART SCORING
2 series · 16 of 20 positions shown, 18 images · non-contrast
Comparison: None.

Addendum:
EXAM:
OVER-READ INTERPRETATION  CT CHEST

The following report is an over-read performed by radiologist Dr.
Noels Marioni [REDACTED] on 10/25/2018. This
over-read does not include interpretation of cardiac or coronary
anatomy or pathology. The coronary calcium score interpretation by
the cardiologist is attached.
CLINICAL DATA: Risk stratification
Coronary Calcium Score
TECHNIQUE: The patient was scanned on a Siemens Somatom 64 slice scanner. Axial
non-contrast 3 mm slices were carried out through the heart. The
data set was analyzed on a dedicated work station and scored using
the Agatson method.

[Series 3: casc 3.0 i36f 2 bestdiast 69 % · axial · 0.36mm/px · z∈[+1100,+1202]mm · 8 of 44 slices shown, 10 images]
[im 5/44  vessel]
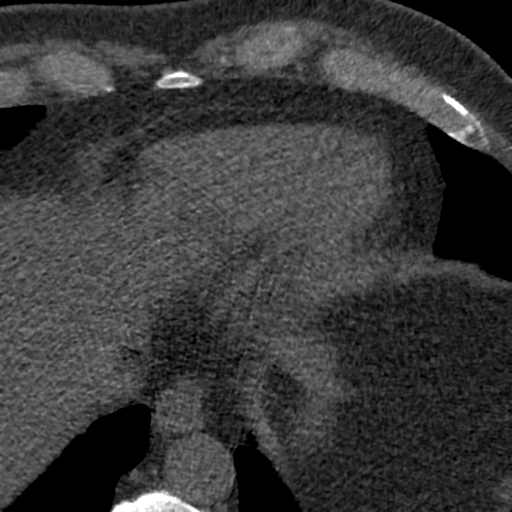
[im 5/44  lung]
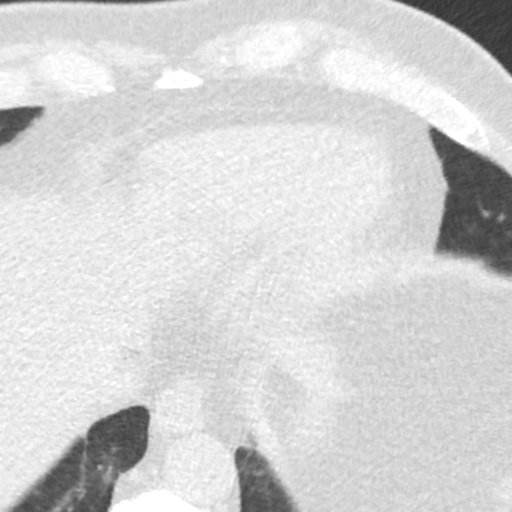
[im 10/44  vessel]
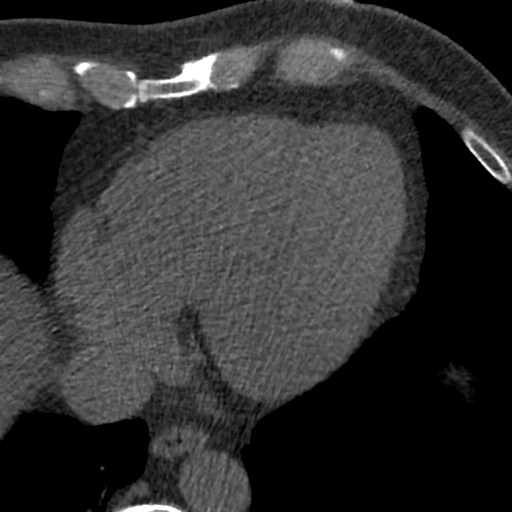
[im 15/44  vessel]
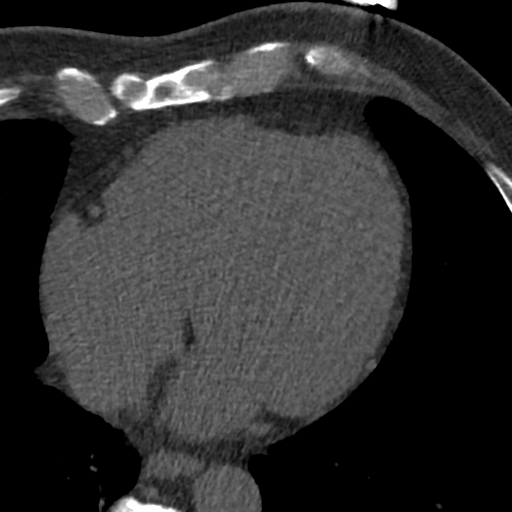
[im 20/44  vessel]
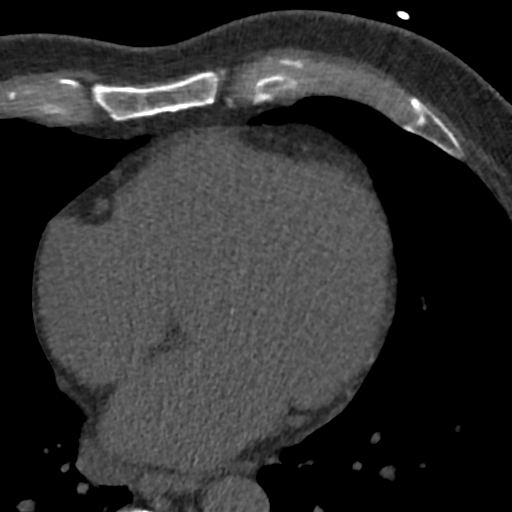
[im 24/44  vessel]
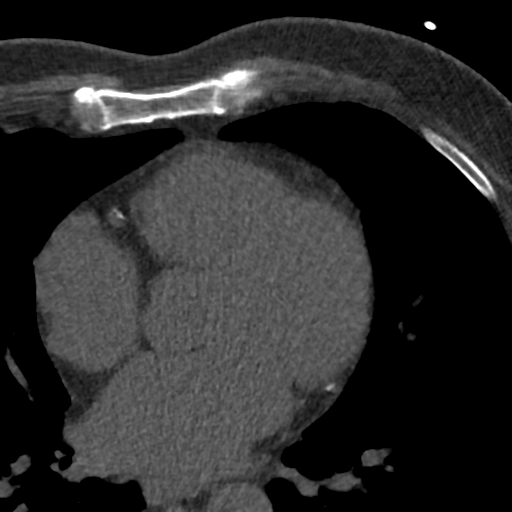
[im 24/44  lung]
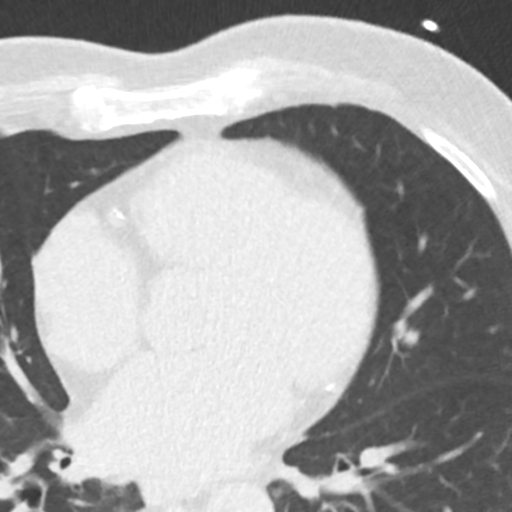
[im 29/44  vessel]
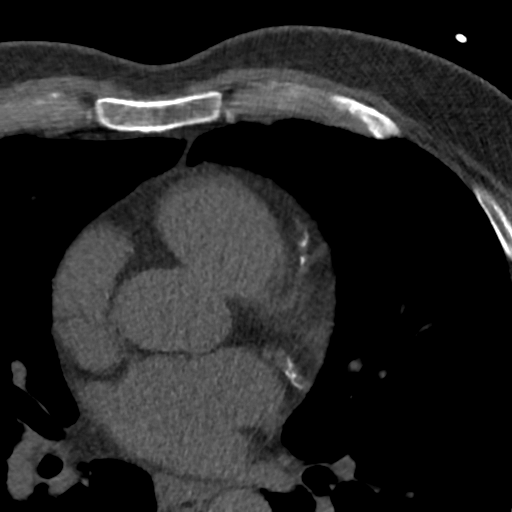
[im 34/44  vessel]
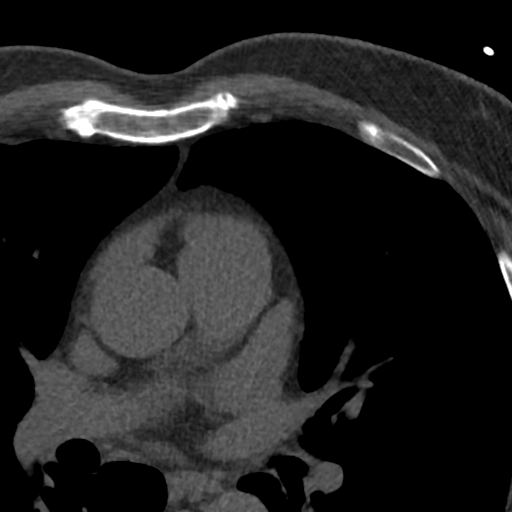
[im 39/44  vessel]
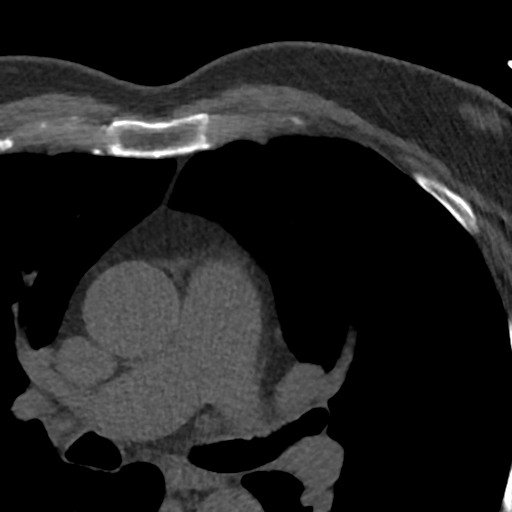

[Series 5: lung st 71 % · axial · 0.69mm/px · z∈[+1097,+1202]mm · 8 of 46 slices shown]
[im 6/46  lung]
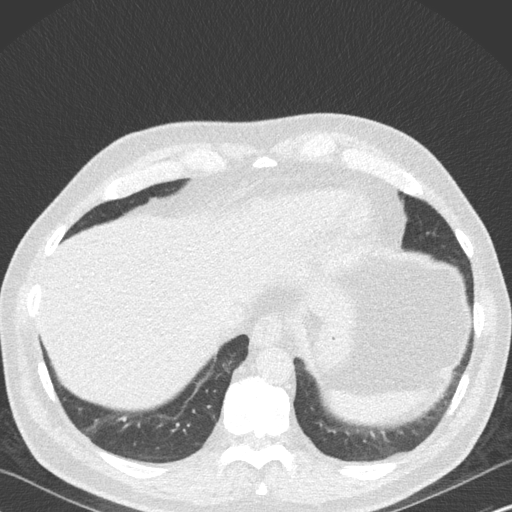
[im 11/46  lung]
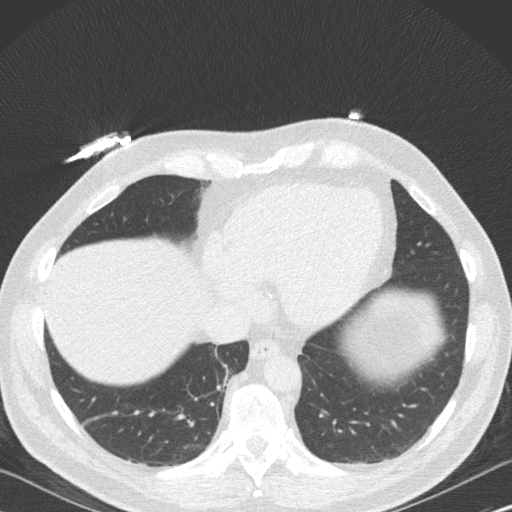
[im 16/46  lung]
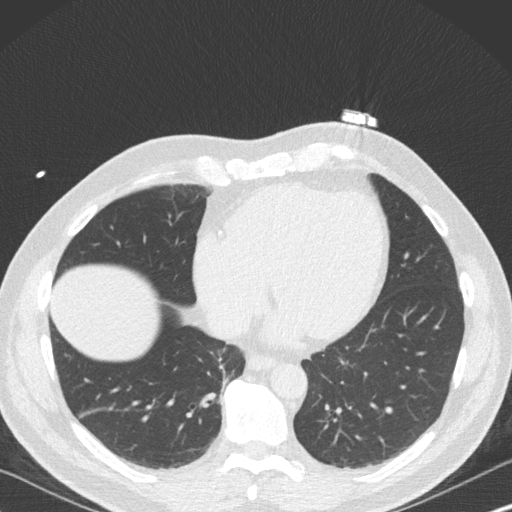
[im 21/46  lung]
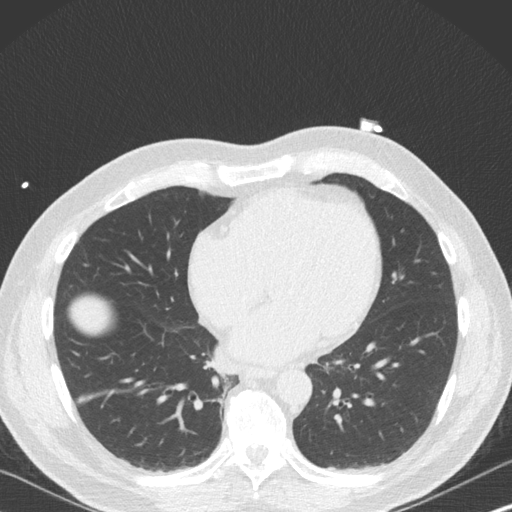
[im 26/46  lung]
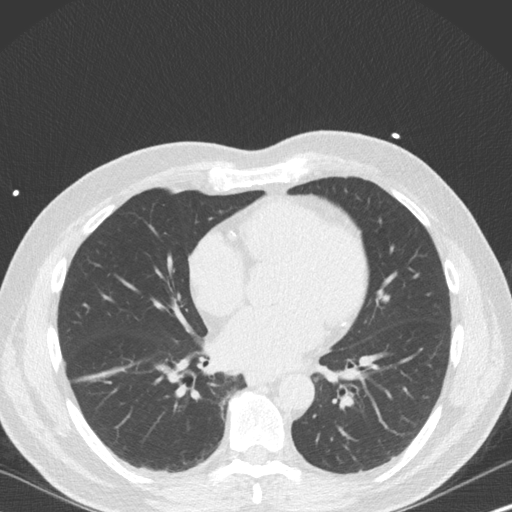
[im 31/46  lung]
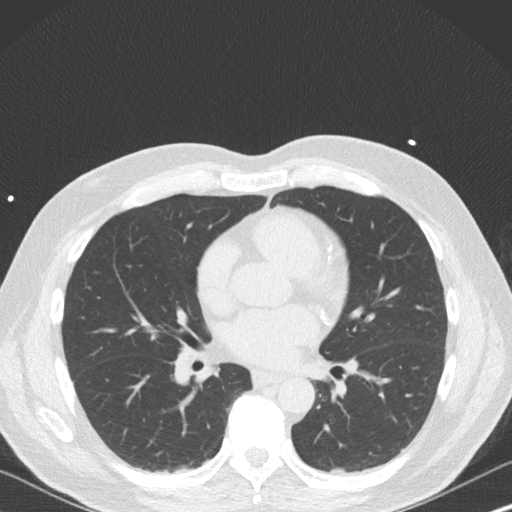
[im 36/46  lung]
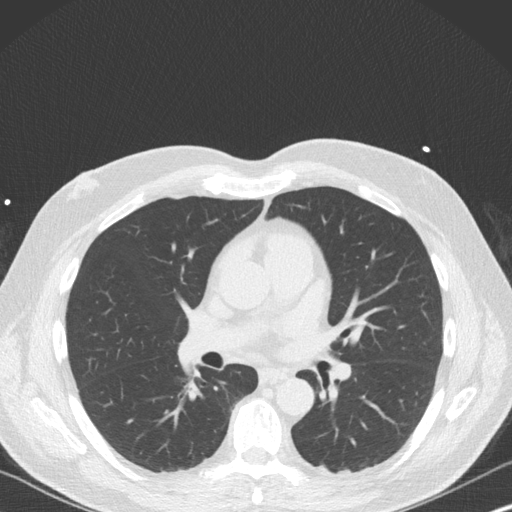
[im 41/46  lung]
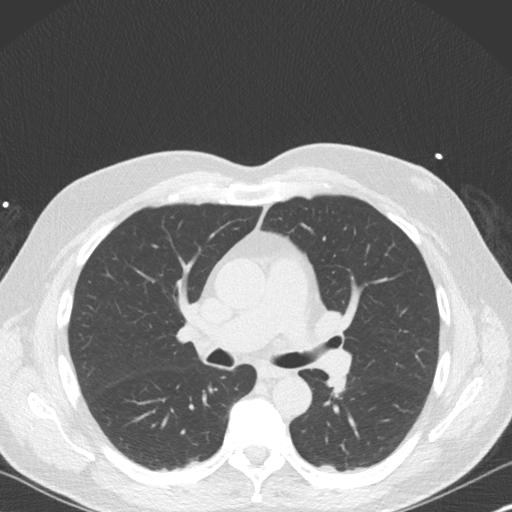

[16 of 20 positions shown; findings below may reference images not displayed]

FINDINGS: Aortic atherosclerosis. Within the visualized portions of the thorax
there are no suspicious appearing pulmonary nodules or masses, there
is no acute consolidative airspace disease, no pleural effusions, no
pneumothorax and no lymphadenopathy. Visualized portions of the
upper abdomen demonstrates diffuse low attenuation throughout the
visualized portions of the hepatic parenchyma, indicative of hepatic
steatosis. There are no aggressive appearing lytic or blastic
lesions noted in the visualized portions of the skeleton.
IMPRESSION: 1.  Aortic Atherosclerosis ([id number]).
2. Hepatic steatosis.
FINDINGS: Non-cardiac: See separate report from [REDACTED].

Ascending aorta: Normal diameter 3.4 cm

Pericardium: Normal

Coronary arteries: Significant 3 vessel coronary calcium noted
IMPRESSION: Coronary calcium score of 774. This was 80 th percentile for age and
sex matched control.

Blade Aujla

*** End of Addendum ***

## 2020-08-21 DIAGNOSIS — E785 Hyperlipidemia, unspecified: Secondary | ICD-10-CM | POA: Diagnosis not present

## 2020-08-21 DIAGNOSIS — Z125 Encounter for screening for malignant neoplasm of prostate: Secondary | ICD-10-CM | POA: Diagnosis not present

## 2020-08-21 DIAGNOSIS — I10 Essential (primary) hypertension: Secondary | ICD-10-CM | POA: Diagnosis not present

## 2020-08-21 DIAGNOSIS — E1169 Type 2 diabetes mellitus with other specified complication: Secondary | ICD-10-CM | POA: Diagnosis not present

## 2020-09-05 DIAGNOSIS — Z0000 Encounter for general adult medical examination without abnormal findings: Secondary | ICD-10-CM | POA: Diagnosis not present

## 2020-09-05 DIAGNOSIS — L409 Psoriasis, unspecified: Secondary | ICD-10-CM | POA: Diagnosis not present

## 2020-09-05 DIAGNOSIS — E785 Hyperlipidemia, unspecified: Secondary | ICD-10-CM | POA: Diagnosis not present

## 2020-09-05 DIAGNOSIS — E1169 Type 2 diabetes mellitus with other specified complication: Secondary | ICD-10-CM | POA: Diagnosis not present

## 2020-09-05 DIAGNOSIS — R82998 Other abnormal findings in urine: Secondary | ICD-10-CM | POA: Diagnosis not present

## 2020-10-16 DIAGNOSIS — Z1212 Encounter for screening for malignant neoplasm of rectum: Secondary | ICD-10-CM | POA: Diagnosis not present

## 2020-11-08 DIAGNOSIS — D225 Melanocytic nevi of trunk: Secondary | ICD-10-CM | POA: Diagnosis not present

## 2020-11-08 DIAGNOSIS — L400 Psoriasis vulgaris: Secondary | ICD-10-CM | POA: Diagnosis not present

## 2020-11-08 DIAGNOSIS — L821 Other seborrheic keratosis: Secondary | ICD-10-CM | POA: Diagnosis not present

## 2020-11-08 DIAGNOSIS — D692 Other nonthrombocytopenic purpura: Secondary | ICD-10-CM | POA: Diagnosis not present

## 2020-12-19 DIAGNOSIS — M9903 Segmental and somatic dysfunction of lumbar region: Secondary | ICD-10-CM | POA: Diagnosis not present

## 2020-12-19 DIAGNOSIS — M47816 Spondylosis without myelopathy or radiculopathy, lumbar region: Secondary | ICD-10-CM | POA: Diagnosis not present

## 2020-12-24 DIAGNOSIS — M9903 Segmental and somatic dysfunction of lumbar region: Secondary | ICD-10-CM | POA: Diagnosis not present

## 2020-12-24 DIAGNOSIS — M47816 Spondylosis without myelopathy or radiculopathy, lumbar region: Secondary | ICD-10-CM | POA: Diagnosis not present

## 2021-01-08 DIAGNOSIS — M47816 Spondylosis without myelopathy or radiculopathy, lumbar region: Secondary | ICD-10-CM | POA: Diagnosis not present

## 2021-01-08 DIAGNOSIS — M9903 Segmental and somatic dysfunction of lumbar region: Secondary | ICD-10-CM | POA: Diagnosis not present

## 2021-01-29 NOTE — Progress Notes (Signed)
Cardiology Office Note   Date:  02/06/2021   ID:  Glenn Moore, DOB 07-Sep-1946, MRN [id number]  PCP:  Sueanne Margarita, DO  Cardiologist:  Dr. Johnsie Cancel    No chief complaint on file.     History of Present Illness: Glenn Moore is a 75 y.o. male who presents for HTN, HLD and CAD follow up.    He had a normal cardac CTA in 2007 and a calcium score of 12. A1c was 7.5.  Stress test 08/2014 was normal .  He continues to Golf walks a lot There is no associated SOB, diaphoresis, palpitations or syncope.   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 Chest pain   . DM (diabetes mellitus) ([place])   . HTN (hypertension)     Past Surgical History:  Procedure Laterality Date  . COLONOSCOPY    . PILONIDAL CYST EXCISION       Current Outpatient Medications  Medication Sig Dispense Refill  . acetaminophen (TYLENOL) 500 MG tablet Take 1,000 mg by mouth every 6 (six) hours as needed for moderate pain.    Marland Kitchen ALPRAZolam (XANAX) 0.5 MG tablet Take 0.5 mg by mouth at bedtime as needed for anxiety.    Marland Kitchen amLODipine (NORVASC) 5 MG tablet Take 5 mg by mouth daily.    Marland Kitchen doxazosin (CARDURA) 4 MG tablet Take 4 mg by mouth daily.     . INVOKANA 300 MG TABS tablet Take 300 mg by mouth daily.    Marland Kitchen losartan (COZAAR) 100 MG tablet Take 100 mg by mouth daily.    . metFORMIN (GLUCOPHAGE) 850 MG tablet Take 850 mg by mouth 3 (three) times daily.     . metoprolol (TOPROL-XL) 50 MG 24 hr tablet Take 50 mg by mouth daily.     . rosuvastatin  (CRESTOR) 5 MG tablet Take 5 mg by mouth every other day.    Nelva Nay SOLOSTAR 300 UNIT/ML SOPN Inject 20 Units into the skin daily.  0   No current facility-administered medications for this visit.    Allergies:   Patient has no known allergies.    Social History:  The patient  reports that he has never smoked. He has never used smokeless tobacco. He reports current alcohol use of about 4.0 standard drinks of alcohol per week. He reports that he does not use drugs.   Family History:  The patient's family history includes Diabetes in his mother; Heart disease in his father and unknown relative.    ROS:  General:no colds or fevers, no weight changes Skin:no rashes or ulcers HEENT:no blurred vision, no congestion CV:see HPI PUL:see HPI GI:no diarrhea constipation or melena, no indigestion GU:no hematuria, no dysuria MS:no joint pain, no claudication Neuro:no syncope, no lightheadedness Endo:+ diabetes, no thyroid disease  Wt Readings from Last 3 Encounters:  01/31/20 90.7 kg  10/25/18 93.9 kg  10/13/18 93.9 kg     PHYSICAL EXAM: VS:  There were no vitals  taken for this visit. , BMI There is no height or weight on file to calculate BMI. General:Pleasant affect, NAD Skin:Warm and dry, brisk capillary refill HEENT:normocephalic, sclera clear, mucus membranes moist Neck:supple, no JVD, no bruits  Heart:S1S2 RRR without murmur, gallup, rub or click Lungs:clear without rales, rhonchi, or wheezes XIP:JASN, non tender, + BS, do not palpate liver spleen or masses Ext:no lower ext edema, 2+ pedal pulses, 2+ radial pulses   EKG:   10/12/18 SR rate 66 new LBBB  10/13/17 SR rate 70 normal 02/06/2021 SR Rate 60 normal    Recent Labs: No results found for requested labs within last 8760 hours.    Lipid Panel No results found for: CHOL, TRIG, HDL, CHOLHDL, VLDL, LDLCALC, LDLDIRECT     Other studies Reviewed: Additional studies/ records that were reviewed today include: ETT stress  myovue 10/25/18    ASSESSMENT AND PLAN:  1.  HTN seems borderline controlled primary d/c HCTZ when changing DM meds as he felt glucosyria would increase. Will leave adjustments up to him  2. CAD:   High calcium score for age Normal myovue  10/25/18 active with no angina continue beta blocker asa and statin   3. Hyperlipidemia followed by Dr. Nyoka Cowden LDL 48 on crestor   4. DM-2 Discussed low carb diet.  Target hemoglobin A1c is 6.5 or less.  Continue current medications.  5. LBBB:  New 2019 yearly ECG no evidence of high grade heart block ECG today with no BBB   Current medicines are reviewed with the patient today.  The patient Has no concerns regarding medicines.  The following changes have been made:  None  Labs/ tests ordered today include:None   Disposition:   FU:  In a year   Signed, Jenkins Rouge, MD  02/06/2021 8:45 AM    [facility] [address], [address] [place] [address], [place] Phone: [phone]; Fax: [phone]

## 2021-02-06 ENCOUNTER — Other Ambulatory Visit: Payer: Self-pay

## 2021-02-06 ENCOUNTER — Ambulatory Visit: Payer: Medicare Other | Admitting: Cardiovascular Disease

## 2021-02-06 ENCOUNTER — Encounter: Payer: Self-pay | Admitting: Cardiovascular Disease

## 2021-02-06 VITALS — BP 170/76 | HR 60 | Ht 74.0 in | Wt 187.0 lb

## 2021-02-06 DIAGNOSIS — I2510 Atherosclerotic heart disease of native coronary artery without angina pectoris: Secondary | ICD-10-CM | POA: Diagnosis not present

## 2021-02-06 NOTE — Patient Instructions (Signed)

## 2021-04-15 DIAGNOSIS — E1169 Type 2 diabetes mellitus with other specified complication: Secondary | ICD-10-CM | POA: Diagnosis not present

## 2021-04-15 DIAGNOSIS — E785 Hyperlipidemia, unspecified: Secondary | ICD-10-CM | POA: Diagnosis not present

## 2021-04-15 DIAGNOSIS — I25118 Atherosclerotic heart disease of native coronary artery with other forms of angina pectoris: Secondary | ICD-10-CM | POA: Diagnosis not present

## 2021-04-15 DIAGNOSIS — L409 Psoriasis, unspecified: Secondary | ICD-10-CM | POA: Diagnosis not present

## 2021-05-08 DIAGNOSIS — R269 Unspecified abnormalities of gait and mobility: Secondary | ICD-10-CM | POA: Diagnosis not present

## 2021-05-08 DIAGNOSIS — T148XXA Other injury of unspecified body region, initial encounter: Secondary | ICD-10-CM | POA: Diagnosis not present

## 2021-05-08 DIAGNOSIS — M25551 Pain in right hip: Secondary | ICD-10-CM | POA: Diagnosis not present

## 2021-05-08 DIAGNOSIS — M545 Low back pain, unspecified: Secondary | ICD-10-CM | POA: Diagnosis not present

## 2021-05-29 DIAGNOSIS — M25551 Pain in right hip: Secondary | ICD-10-CM | POA: Diagnosis not present

## 2021-05-29 DIAGNOSIS — R269 Unspecified abnormalities of gait and mobility: Secondary | ICD-10-CM | POA: Diagnosis not present

## 2021-05-29 DIAGNOSIS — M545 Low back pain, unspecified: Secondary | ICD-10-CM | POA: Diagnosis not present

## 2021-05-29 DIAGNOSIS — T148XXA Other injury of unspecified body region, initial encounter: Secondary | ICD-10-CM | POA: Diagnosis not present

## 2021-06-03 DIAGNOSIS — E1169 Type 2 diabetes mellitus with other specified complication: Secondary | ICD-10-CM | POA: Diagnosis not present

## 2021-06-03 DIAGNOSIS — M25551 Pain in right hip: Secondary | ICD-10-CM | POA: Diagnosis not present

## 2021-06-03 DIAGNOSIS — L409 Psoriasis, unspecified: Secondary | ICD-10-CM | POA: Diagnosis not present

## 2021-06-03 DIAGNOSIS — R269 Unspecified abnormalities of gait and mobility: Secondary | ICD-10-CM | POA: Diagnosis not present

## 2021-06-03 DIAGNOSIS — I25118 Atherosclerotic heart disease of native coronary artery with other forms of angina pectoris: Secondary | ICD-10-CM | POA: Diagnosis not present

## 2021-06-03 DIAGNOSIS — E785 Hyperlipidemia, unspecified: Secondary | ICD-10-CM | POA: Diagnosis not present

## 2021-06-03 DIAGNOSIS — M545 Low back pain, unspecified: Secondary | ICD-10-CM | POA: Diagnosis not present

## 2021-06-03 DIAGNOSIS — T148XXA Other injury of unspecified body region, initial encounter: Secondary | ICD-10-CM | POA: Diagnosis not present

## 2021-06-13 DIAGNOSIS — H11153 Pinguecula, bilateral: Secondary | ICD-10-CM | POA: Diagnosis not present

## 2021-06-13 DIAGNOSIS — H18413 Arcus senilis, bilateral: Secondary | ICD-10-CM | POA: Diagnosis not present

## 2021-06-13 DIAGNOSIS — E119 Type 2 diabetes mellitus without complications: Secondary | ICD-10-CM | POA: Diagnosis not present

## 2021-06-13 DIAGNOSIS — Z961 Presence of intraocular lens: Secondary | ICD-10-CM | POA: Diagnosis not present

## 2021-06-17 DIAGNOSIS — R269 Unspecified abnormalities of gait and mobility: Secondary | ICD-10-CM | POA: Diagnosis not present

## 2021-06-17 DIAGNOSIS — M25551 Pain in right hip: Secondary | ICD-10-CM | POA: Diagnosis not present

## 2021-06-17 DIAGNOSIS — T148XXA Other injury of unspecified body region, initial encounter: Secondary | ICD-10-CM | POA: Diagnosis not present

## 2021-06-17 DIAGNOSIS — M545 Low back pain, unspecified: Secondary | ICD-10-CM | POA: Diagnosis not present

## 2021-07-01 DIAGNOSIS — M25551 Pain in right hip: Secondary | ICD-10-CM | POA: Diagnosis not present

## 2021-07-01 DIAGNOSIS — T148XXA Other injury of unspecified body region, initial encounter: Secondary | ICD-10-CM | POA: Diagnosis not present

## 2021-07-01 DIAGNOSIS — M545 Low back pain, unspecified: Secondary | ICD-10-CM | POA: Diagnosis not present

## 2021-07-01 DIAGNOSIS — R269 Unspecified abnormalities of gait and mobility: Secondary | ICD-10-CM | POA: Diagnosis not present

## 2021-07-08 DIAGNOSIS — T148XXA Other injury of unspecified body region, initial encounter: Secondary | ICD-10-CM | POA: Diagnosis not present

## 2021-07-08 DIAGNOSIS — R269 Unspecified abnormalities of gait and mobility: Secondary | ICD-10-CM | POA: Diagnosis not present

## 2021-07-08 DIAGNOSIS — M545 Low back pain, unspecified: Secondary | ICD-10-CM | POA: Diagnosis not present

## 2021-07-08 DIAGNOSIS — M25551 Pain in right hip: Secondary | ICD-10-CM | POA: Diagnosis not present

## 2021-07-18 DIAGNOSIS — M25551 Pain in right hip: Secondary | ICD-10-CM | POA: Diagnosis not present

## 2021-07-18 DIAGNOSIS — T148XXA Other injury of unspecified body region, initial encounter: Secondary | ICD-10-CM | POA: Diagnosis not present

## 2021-07-18 DIAGNOSIS — R269 Unspecified abnormalities of gait and mobility: Secondary | ICD-10-CM | POA: Diagnosis not present

## 2021-07-18 DIAGNOSIS — M545 Low back pain, unspecified: Secondary | ICD-10-CM | POA: Diagnosis not present

## 2021-09-13 DIAGNOSIS — Z125 Encounter for screening for malignant neoplasm of prostate: Secondary | ICD-10-CM | POA: Diagnosis not present

## 2021-09-13 DIAGNOSIS — E1169 Type 2 diabetes mellitus with other specified complication: Secondary | ICD-10-CM | POA: Diagnosis not present

## 2021-09-13 DIAGNOSIS — E785 Hyperlipidemia, unspecified: Secondary | ICD-10-CM | POA: Diagnosis not present

## 2021-09-20 DIAGNOSIS — R82998 Other abnormal findings in urine: Secondary | ICD-10-CM | POA: Diagnosis not present

## 2021-09-20 DIAGNOSIS — R972 Elevated prostate specific antigen [PSA]: Secondary | ICD-10-CM | POA: Diagnosis not present

## 2021-09-24 DIAGNOSIS — M545 Low back pain, unspecified: Secondary | ICD-10-CM | POA: Diagnosis not present

## 2021-09-24 DIAGNOSIS — T148XXA Other injury of unspecified body region, initial encounter: Secondary | ICD-10-CM | POA: Diagnosis not present

## 2021-09-24 DIAGNOSIS — M25551 Pain in right hip: Secondary | ICD-10-CM | POA: Diagnosis not present

## 2021-09-24 DIAGNOSIS — R269 Unspecified abnormalities of gait and mobility: Secondary | ICD-10-CM | POA: Diagnosis not present

## 2021-10-07 DIAGNOSIS — M545 Low back pain, unspecified: Secondary | ICD-10-CM | POA: Diagnosis not present

## 2021-10-07 DIAGNOSIS — M25551 Pain in right hip: Secondary | ICD-10-CM | POA: Diagnosis not present

## 2021-10-07 DIAGNOSIS — R269 Unspecified abnormalities of gait and mobility: Secondary | ICD-10-CM | POA: Diagnosis not present

## 2021-10-07 DIAGNOSIS — T148XXA Other injury of unspecified body region, initial encounter: Secondary | ICD-10-CM | POA: Diagnosis not present

## 2021-10-15 DIAGNOSIS — M545 Low back pain, unspecified: Secondary | ICD-10-CM | POA: Diagnosis not present

## 2021-10-15 DIAGNOSIS — M25551 Pain in right hip: Secondary | ICD-10-CM | POA: Diagnosis not present

## 2021-10-15 DIAGNOSIS — R269 Unspecified abnormalities of gait and mobility: Secondary | ICD-10-CM | POA: Diagnosis not present

## 2021-10-15 DIAGNOSIS — T148XXA Other injury of unspecified body region, initial encounter: Secondary | ICD-10-CM | POA: Diagnosis not present

## 2021-11-12 DIAGNOSIS — L218 Other seborrheic dermatitis: Secondary | ICD-10-CM | POA: Diagnosis not present

## 2021-11-12 DIAGNOSIS — L821 Other seborrheic keratosis: Secondary | ICD-10-CM | POA: Diagnosis not present

## 2021-11-12 DIAGNOSIS — Z85828 Personal history of other malignant neoplasm of skin: Secondary | ICD-10-CM | POA: Diagnosis not present

## 2022-02-26 DIAGNOSIS — J302 Other seasonal allergic rhinitis: Secondary | ICD-10-CM | POA: Diagnosis not present

## 2022-02-26 DIAGNOSIS — J0100 Acute maxillary sinusitis, unspecified: Secondary | ICD-10-CM | POA: Diagnosis not present

## 2022-03-03 NOTE — Progress Notes (Signed)
? ?Cardiology Office Note ? ? ?Date:  03/12/2022  ? ?ID:  Glenn Moore, DOB 1946/01/30, MRN [id number] ? ?PCP:  Glenn Margarita, DO  ?Cardiologist:  Dr. Johnsie Moore   ? ?No chief complaint on file. ? ? ?  ?History of Present Illness: ?Glenn Moore is a 76 y.o. male who presents for HTN, HLD and CAD follow up.  Has had intermittent LBBB in past  ? ?He had a normal cardac CTA in 2007 and a calcium score of 12. A1c was 7.5.  Stress test 08/2014 was normal .  He continues to Golf walks a lot There is no associated SOB, diaphoresis, palpitations or syncope.  ? ?He is on crestor for HLD ? 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 ?ECG 10/13/18 showed new LBBB ? ?F/U calcium score 10/25/18 increased to 774 83 th percentile for age and sex ? ?F/U Myovue 10/25/18 normal no ischemia EF 62%  ? ?BP labile  Some white coat component normal at home ? ? ?No cardiac issues active  ? ? ?Past Medical History:  ?Diagnosis Date  ? Chest pain   ? DM (diabetes mellitus) ([place])   ? HTN (hypertension)   ? ? ?Past Surgical History:  ?Procedure Laterality Date  ? COLONOSCOPY    ? PILONIDAL CYST EXCISION    ? ? ? ?Current Outpatient Medications  ?Medication Sig Dispense Refill  ? acetaminophen (TYLENOL) 500 MG tablet Take 1,000 mg by mouth every 6 (six) hours as needed for moderate pain.    ? ALPRAZolam (XANAX) 0.5 MG tablet Take 0.5 mg by mouth at bedtime as needed for anxiety.    ? amLODipine (NORVASC) 5 MG tablet Take 5 mg by mouth daily.    ? doxazosin (CARDURA) 4 MG tablet Take 4 mg by mouth daily.    ? INVOKANA 300 MG TABS tablet Take 300 mg by mouth daily.    ? losartan (COZAAR) 100 MG tablet Take 100 mg by mouth daily.    ? metFORMIN (GLUCOPHAGE) 850 MG tablet Take 850 mg by mouth 3 (three) times daily.    ? metoprolol (TOPROL-XL) 50 MG 24 hr tablet Take 50 mg by mouth daily.    ? Omega-3 Fatty Acids (FISH  OIL) 1000 MG CAPS Take 1 capsule by mouth daily.    ? rosuvastatin (CRESTOR) 5 MG tablet Take 5 mg by mouth every other day.    ? TOUJEO SOLOSTAR 300 UNIT/ML SOPN Inject 20 Units into the skin daily.  0  ? ?No current facility-administered medications for this visit.  ? ? ?Allergies:   Patient has no known allergies.  ? ? ?Social History:  The patient  reports that he has never smoked. He has never used smokeless tobacco. He reports current alcohol use of about 4.0 standard drinks per week. He reports that he does not use drugs.  ? ?Family History:  The patient's family history includes Diabetes in his mother; Heart disease in his father and unknown relative.  ? ? ?ROS:  General:no colds or fevers, no weight changes ?Skin:no rashes or ulcers ?HEENT:no blurred vision, no congestion ?CV:see HPI ?PUL:see HPI ?GI:no diarrhea constipation or melena, no indigestion ?GU:no hematuria, no dysuria ?MS:no joint pain, no claudication ?Neuro:no syncope, no lightheadedness ?Endo:+ diabetes, no thyroid disease ? ?Wt Readings from Last 3 Encounters:  ?03/12/22 180 lb 12.8 oz (82  kg)  ?02/06/21 187 lb (84.8 kg)  ?01/31/20 200 lb (90.7 kg)  ?  ? ?PHYSICAL EXAM: ?VS:  BP 136/78   Pulse 67   Ht '6\' 2"'$  (1.88 m)   Wt 180 lb 12.8 oz (82 kg)   SpO2 99%   BMI 23.21 kg/m?  , BMI Body mass index is 23.21 kg/m?. ? ?Affect appropriate ?Healthy:  appears stated age ?HEENT: normal ?Neck supple with no adenopathy ?JVP normal left  bruits no thyromegaly ?Lungs clear with no wheezing and good diaphragmatic motion ?Heart:  S1/S2 no murmur, no rub, gallop or click ?PMI normal ?Abdomen: benighn, BS positve, no tenderness, no AAA ?no bruit.  No HSM or HJR ?Distal pulses intact with no bruits ?No edema ?Neuro non-focal ?Skin warm and dry ?No muscular weakness ? ?  ?EKG:   10/12/18 SR rate 66 new LBBB  10/13/17 SR rate 70 normal 03/12/2022 SR Rate 60 normal  ?03/12/2022 SR rate 67 ICLBBB  ? ? ?Recent Labs: ?No results found for requested labs within last  8760 hours.  ? ? ?Lipid Panel ?No results found for: CHOL, TRIG, HDL, CHOLHDL, VLDL, LDLCALC, LDLDIRECT ?  ? ? ?Other studies Reviewed: ?Additional studies/ records that were reviewed today include: ETT stress myovue 10/25/18  ? ? ?ASSESSMENT AND PLAN: ? ?1.  HTN Well controlled.  Continue current medications and low sodium Dash type diet.  ? ? ?2. CAD:   High calcium score for age Normal myovue  10/25/18 active with no angina continue beta blocker asa and statin  ? ?3. Hyperlipidemia followed by Dr. Nyoka Moore LDL 48 on crestor  ? ?4. DM-2 Discussed low carb diet.  Target hemoglobin A1c is 6.5 or less.  Continue current medications. ? ?5. LBBB:  New 2019 yearly ECG no evidence of high grade heart block ECG today with no BBB ? ? ?Current medicines are reviewed with the patient today.  The patient Has no concerns regarding medicines. ? ?The following changes have been made:  None  ?Labs/ tests ordered today include:None  ? ?Disposition:   FU:  In a year  ? ?Signed, ?Jenkins Rouge, MD  ?03/12/2022 8:42 AM    ?[facility] [place], [place] ?[place], [place] ?Phone: [phone]; Fax: [phone]  ?[phone] ?

## 2022-03-12 ENCOUNTER — Ambulatory Visit: Payer: Medicare Other | Admitting: Cardiovascular Disease

## 2022-03-12 ENCOUNTER — Encounter: Payer: Self-pay | Admitting: Cardiovascular Disease

## 2022-03-12 VITALS — BP 136/78 | HR 67 | Ht 74.0 in | Wt 180.8 lb

## 2022-03-12 DIAGNOSIS — I2510 Atherosclerotic heart disease of native coronary artery without angina pectoris: Secondary | ICD-10-CM

## 2022-03-12 DIAGNOSIS — I447 Left bundle-branch block, unspecified: Secondary | ICD-10-CM

## 2022-03-12 DIAGNOSIS — R0989 Other specified symptoms and signs involving the circulatory and respiratory systems: Secondary | ICD-10-CM | POA: Diagnosis not present

## 2022-03-12 DIAGNOSIS — E782 Mixed hyperlipidemia: Secondary | ICD-10-CM

## 2022-03-12 NOTE — Patient Instructions (Signed)
Medication Instructions:  ?Your physician recommends that you continue on your current medications as directed. Please refer to the Current Medication list given to you today. ? ?*If you need a refill on your cardiac medications before your next appointment, please call your pharmacy* ? ?Lab Work: ?If you have labs (blood work) drawn today and your tests are completely normal, you will receive your results only by: ?MyChart Message (if you have MyChart) OR ?A paper copy in the mail ?If you have any lab test that is abnormal or we need to change your treatment, we will call you to review the results. ? ?Testing/Procedures: ?Your physician has requested that you have a carotid duplex in October. This test is an ultrasound of the carotid arteries in your neck. It looks at blood flow through these arteries that supply the brain with blood. Allow one hour for this exam. There are no restrictions or special instructions.  ? ?Follow-Up: ?At [facility], you and your health needs are our priority.  As part of our continuing mission to provide you with exceptional heart care, we have created designated Provider Care Teams.  These Care Teams include your primary Cardiologist (physician) and Advanced Practice Providers (APPs -  Physician Assistants and Nurse Practitioners) who all work together to provide you with the care you need, when you need it. ? ?We recommend signing up for the patient portal called "MyChart".  Sign up information is provided on this After Visit Summary.  MyChart is used to connect with patients for Virtual Visits (Telemedicine).  Patients are able to view lab/test results, encounter notes, upcoming appointments, etc.  Non-urgent messages can be sent to your provider as well.   ?To learn more about what you can do with MyChart, go to [email or web address].   ? ?Your next appointment:   ?1 year(s) ? ?The format for your next appointment:   ?In Person ? ?Provider:   ?Jenkins Rouge, MD  { ? ? ? ?Important Information About Sugar ? ? ? ? ?  ?

## 2022-03-26 DIAGNOSIS — E1169 Type 2 diabetes mellitus with other specified complication: Secondary | ICD-10-CM | POA: Diagnosis not present

## 2022-03-26 DIAGNOSIS — N1831 Chronic kidney disease, stage 3a: Secondary | ICD-10-CM | POA: Diagnosis not present

## 2022-03-26 DIAGNOSIS — I700 Atherosclerosis of aorta: Secondary | ICD-10-CM | POA: Diagnosis not present

## 2022-03-26 DIAGNOSIS — I25118 Atherosclerotic heart disease of native coronary artery with other forms of angina pectoris: Secondary | ICD-10-CM | POA: Diagnosis not present

## 2022-07-03 DIAGNOSIS — I25118 Atherosclerotic heart disease of native coronary artery with other forms of angina pectoris: Secondary | ICD-10-CM | POA: Diagnosis not present

## 2022-07-03 DIAGNOSIS — Z794 Long term (current) use of insulin: Secondary | ICD-10-CM | POA: Diagnosis not present

## 2022-07-03 DIAGNOSIS — E1169 Type 2 diabetes mellitus with other specified complication: Secondary | ICD-10-CM | POA: Diagnosis not present

## 2022-07-03 DIAGNOSIS — I700 Atherosclerosis of aorta: Secondary | ICD-10-CM | POA: Diagnosis not present

## 2022-07-22 DIAGNOSIS — H11153 Pinguecula, bilateral: Secondary | ICD-10-CM | POA: Diagnosis not present

## 2022-07-22 DIAGNOSIS — H18413 Arcus senilis, bilateral: Secondary | ICD-10-CM | POA: Diagnosis not present

## 2022-07-22 DIAGNOSIS — Z961 Presence of intraocular lens: Secondary | ICD-10-CM | POA: Diagnosis not present

## 2022-07-22 DIAGNOSIS — H43811 Vitreous degeneration, right eye: Secondary | ICD-10-CM | POA: Diagnosis not present

## 2022-08-11 ENCOUNTER — Ambulatory Visit (HOSPITAL_COMMUNITY)
Admission: RE | Admit: 2022-08-11 | Discharge: 2022-08-11 | Disposition: A | Discharge status: Home / Self Care | Payer: Medicare Other | Source: Ambulatory Visit | Attending: Cardiovascular Disease | Admitting: Cardiovascular Disease

## 2022-08-11 DIAGNOSIS — R0989 Other specified symptoms and signs involving the circulatory and respiratory systems: Secondary | ICD-10-CM | POA: Insufficient documentation

## 2022-08-26 DIAGNOSIS — K121 Other forms of stomatitis: Secondary | ICD-10-CM | POA: Diagnosis not present

## 2022-09-19 DIAGNOSIS — E1169 Type 2 diabetes mellitus with other specified complication: Secondary | ICD-10-CM | POA: Diagnosis not present

## 2022-09-19 DIAGNOSIS — E785 Hyperlipidemia, unspecified: Secondary | ICD-10-CM | POA: Diagnosis not present

## 2022-09-19 DIAGNOSIS — Z125 Encounter for screening for malignant neoplasm of prostate: Secondary | ICD-10-CM | POA: Diagnosis not present

## 2022-09-19 DIAGNOSIS — I10 Essential (primary) hypertension: Secondary | ICD-10-CM | POA: Diagnosis not present

## 2022-09-26 DIAGNOSIS — R82998 Other abnormal findings in urine: Secondary | ICD-10-CM | POA: Diagnosis not present

## 2022-09-26 DIAGNOSIS — I700 Atherosclerosis of aorta: Secondary | ICD-10-CM | POA: Diagnosis not present

## 2022-09-26 DIAGNOSIS — Z0000 Encounter for general adult medical examination without abnormal findings: Secondary | ICD-10-CM | POA: Diagnosis not present

## 2022-09-26 DIAGNOSIS — Z794 Long term (current) use of insulin: Secondary | ICD-10-CM | POA: Diagnosis not present

## 2022-09-26 DIAGNOSIS — E1169 Type 2 diabetes mellitus with other specified complication: Secondary | ICD-10-CM | POA: Diagnosis not present

## 2022-10-15 ENCOUNTER — Encounter: Payer: Self-pay | Admitting: Internal Medicine

## 2022-11-12 DIAGNOSIS — D2262 Melanocytic nevi of left upper limb, including shoulder: Secondary | ICD-10-CM | POA: Diagnosis not present

## 2022-11-12 DIAGNOSIS — L570 Actinic keratosis: Secondary | ICD-10-CM | POA: Diagnosis not present

## 2022-11-12 DIAGNOSIS — R21 Rash and other nonspecific skin eruption: Secondary | ICD-10-CM | POA: Diagnosis not present

## 2022-11-12 DIAGNOSIS — L821 Other seborrheic keratosis: Secondary | ICD-10-CM | POA: Diagnosis not present

## 2022-11-12 DIAGNOSIS — L308 Other specified dermatitis: Secondary | ICD-10-CM | POA: Diagnosis not present

## 2022-11-12 DIAGNOSIS — D225 Melanocytic nevi of trunk: Secondary | ICD-10-CM | POA: Diagnosis not present

## 2022-11-12 DIAGNOSIS — D2261 Melanocytic nevi of right upper limb, including shoulder: Secondary | ICD-10-CM | POA: Diagnosis not present

## 2022-11-12 DIAGNOSIS — R972 Elevated prostate specific antigen [PSA]: Secondary | ICD-10-CM | POA: Diagnosis not present

## 2022-11-12 DIAGNOSIS — D485 Neoplasm of uncertain behavior of skin: Secondary | ICD-10-CM | POA: Diagnosis not present

## 2022-11-25 ENCOUNTER — Ambulatory Visit (AMBULATORY_SURGERY_CENTER): Payer: Medicare Other

## 2022-11-25 VITALS — Ht 74.0 in | Wt 178.0 lb

## 2022-11-25 DIAGNOSIS — Z1211 Encounter for screening for malignant neoplasm of colon: Secondary | ICD-10-CM

## 2022-11-25 MED ORDER — NA SULFATE-K SULFATE-MG SULF 17.5-3.13-1.6 GM/177ML PO SOLN: 1.0000 | Freq: Once | ORAL | 0 refills | Status: AC

## 2022-11-25 NOTE — Progress Notes (Signed)

## 2022-12-10 ENCOUNTER — Encounter: Payer: Self-pay | Admitting: Internal Medicine

## 2022-12-19 ENCOUNTER — Telehealth: Payer: Self-pay | Admitting: Internal Medicine

## 2022-12-19 ENCOUNTER — Encounter: Payer: Medicare Other | Admitting: Internal Medicine

## 2022-12-19 DIAGNOSIS — Z1211 Encounter for screening for malignant neoplasm of colon: Secondary | ICD-10-CM

## 2022-12-19 MED ORDER — NA SULFATE-K SULFATE-MG SULF 17.5-3.13-1.6 GM/177ML PO SOLN: 1.0000 | Freq: Once | ORAL | 0 refills | Status: AC

## 2022-12-19 NOTE — Telephone Encounter (Signed)
RX for suprep sent to [facility] on [address].

## 2022-12-19 NOTE — Telephone Encounter (Signed)
Inbound call from patient, states last night around 5PM when he went to open his prep medication it was empty. Patient states that the box did not have any of the containers or the mixing liquid. So patient was unable to prep medication. He called his pharmacy and they stated he would have to call the manufacturer. Patient rescheduled to 2/12 at 2:30 PM. Will need prescription resent to pharmacy.

## 2022-12-22 ENCOUNTER — Ambulatory Visit (AMBULATORY_SURGERY_CENTER): Payer: Medicare Other | Admitting: Internal Medicine

## 2022-12-22 ENCOUNTER — Encounter: Payer: Self-pay | Admitting: Internal Medicine

## 2022-12-22 VITALS — BP 114/66 | HR 69 | Temp 97.3°F | Resp 13 | Ht 74.0 in | Wt 185.0 lb

## 2022-12-22 DIAGNOSIS — D120 Benign neoplasm of cecum: Secondary | ICD-10-CM

## 2022-12-22 DIAGNOSIS — D122 Benign neoplasm of ascending colon: Secondary | ICD-10-CM | POA: Diagnosis not present

## 2022-12-22 DIAGNOSIS — Z1211 Encounter for screening for malignant neoplasm of colon: Secondary | ICD-10-CM

## 2022-12-22 MED ORDER — SODIUM CHLORIDE 0.9 % IV SOLN: 500.0000 mL | Freq: Once | INTRAVENOUS | Status: DC

## 2022-12-22 NOTE — Progress Notes (Signed)
Called to room to assist during endoscopic procedure.  Patient ID and intended procedure confirmed with present staff. Received instructions for my participation in the procedure from the performing physician.  

## 2022-12-22 NOTE — Op Note (Signed)
[facility] Patient Name: Glenn Moore Procedure Date: 12/22/2022 2:05 PM MRN: [id number] Endoscopist: Adline Mango Lecompte , , [id number] Age: 77 Referring MD:  Date of Birth: 1946/10/16 Gender: Male Account #: [id number] Procedure:                Colonoscopy Indications:              Screening for colorectal malignant neoplasm Medicines:                Monitored Anesthesia Care Procedure:                Pre-Anesthesia Assessment:                           - Prior to the procedure, a History and Physical                            was performed, and patient medications and                            allergies were reviewed. The patient's tolerance of                            previous anesthesia was also reviewed. The risks                            and benefits of the procedure and the sedation                            options and risks were discussed with the patient.                            All questions were answered, and informed consent                            was obtained. Prior Anticoagulants: The patient has                            taken no anticoagulant or antiplatelet agents. ASA                            Grade Assessment: III - A patient with severe                            systemic disease. After reviewing the risks and                            benefits, the patient was deemed in satisfactory                            condition to undergo the procedure.                           After obtaining informed consent, the colonoscope  was passed under direct vision. Throughout the                            procedure, the patient's blood pressure, pulse, and                            oxygen saturations were monitored continuously. The                            Olympus CF-HQ190L [id number]) Colonoscope was                            introduced through the anus and advanced to the the                            terminal  ileum. The colonoscopy was performed                            without difficulty. The patient tolerated the                            procedure well. The quality of the bowel                            preparation was good. The terminal ileum, ileocecal                            valve, appendiceal orifice, and rectum were                            photographed. Scope In: 2:27:02 PM Scope Out: 2:45:54 PM Scope Withdrawal Time: 0 hours 14 minutes 33 seconds  Total Procedure Duration: 0 hours 18 minutes 52 seconds  Findings:                 The terminal ileum appeared normal.                           Two sessile polyps were found in the ascending                            colon and cecum. The polyps were 4 to 7 mm in size.                            These polyps were removed with a cold snare.                            Resection and retrieval were complete.                           Multiple diverticula were found in the sigmoid                            colon, descending colon, transverse colon and  ascending colon.                           Non-bleeding internal hemorrhoids were found during                            retroflexion. Complications:            No immediate complications. Estimated Blood Loss:     Estimated blood loss was minimal. Impression:               - The examined portion of the ileum was normal.                           - Two 4 to 7 mm polyps in the ascending colon and                            in the cecum, removed with a cold snare. Resected                            and retrieved.                           - Diverticulosis in the sigmoid colon, in the                            descending colon, in the transverse colon and in                            the ascending colon.                           - Non-bleeding internal hemorrhoids. Recommendation:           - Discharge patient to home (with escort).                            - Await pathology results.                           - The findings and recommendations were discussed                            with the patient. Dr [name],  12/22/2022 2:49:29 PM

## 2022-12-22 NOTE — Progress Notes (Signed)
GASTROENTEROLOGY PROCEDURE H&P NOTE   Primary Care Physician: Sueanne Margarita, DO    Reason for Procedure:   Colon cancer screening  Plan:    Colonoscopy  Patient is appropriate for endoscopic procedure(s) in the ambulatory ([place]) setting.  The nature of the procedure, as well as the risks, benefits, and alternatives were carefully and thoroughly reviewed with the patient. Ample time for discussion and questions allowed. The patient understood, was satisfied, and agreed to proceed.     HPI: Glenn Moore is a 77 y.o. male who presents for colonoscopy for colon cancer screening. Denies blood in stools, changes in bowel habits, or unintentional weight loss. Denies family history of colon cancer.   Past Medical History:  Diagnosis Date   Arthritis    Cataract    Chest pain    DM (diabetes mellitus) ([place])    HTN (hypertension)     Past Surgical History:  Procedure Laterality Date   CATARACT EXTRACTION  2021   COLONOSCOPY     PILONIDAL CYST EXCISION      Prior to Admission medications   Medication Sig Start Date End Date Taking? Authorizing Provider  amLODipine (NORVASC) 5 MG tablet Take 5 mg by mouth daily. 01/06/21  Yes [provider]  BD PEN NEEDLE NANO 2ND GEN 32G X 4 MM MISC SMARTSIG:Injection Daily 09/10/22  Yes [provider]  CONTOUR NEXT TEST test strip daily. 10/29/22  Yes [provider]  doxazosin (CARDURA) 4 MG tablet Take 4 mg by mouth daily.   Yes [provider]  INVOKANA 300 MG TABS tablet Take 300 mg by mouth daily. 01/21/21  Yes [provider]  losartan (COZAAR) 100 MG tablet Take 100 mg by mouth daily. 12/21/19  Yes [provider]  metFORMIN (GLUCOPHAGE) 850 MG tablet Take 850 mg by mouth 3 (three) times daily.   Yes [provider]  metoprolol (TOPROL-XL) 50 MG 24 hr tablet Take 50 mg by mouth daily.   Yes [provider]  Omega-3 Fatty Acids (FISH OIL) 1000 MG CAPS Take 1  capsule by mouth daily.   Yes [provider]  rosuvastatin (CRESTOR) 5 MG tablet Take 5 mg by mouth every other day. 01/19/20  Yes [provider]  TOUJEO SOLOSTAR 300 UNIT/ML SOPN Inject 20 Units into the skin daily. 07/22/18  Yes [provider]  acetaminophen (TYLENOL) 500 MG tablet Take 1,000 mg by mouth every 6 (six) hours as needed for moderate pain.    [provider]    Current Outpatient Medications  Medication Sig Dispense Refill   amLODipine (NORVASC) 5 MG tablet Take 5 mg by mouth daily.     BD PEN NEEDLE NANO 2ND GEN 32G X 4 MM MISC SMARTSIG:Injection Daily     CONTOUR NEXT TEST test strip daily.     doxazosin (CARDURA) 4 MG tablet Take 4 mg by mouth daily.     INVOKANA 300 MG TABS tablet Take 300 mg by mouth daily.     losartan (COZAAR) 100 MG tablet Take 100 mg by mouth daily.     metFORMIN (GLUCOPHAGE) 850 MG tablet Take 850 mg by mouth 3 (three) times daily.     metoprolol (TOPROL-XL) 50 MG 24 hr tablet Take 50 mg by mouth daily.     Omega-3 Fatty Acids (FISH OIL) 1000 MG CAPS Take 1 capsule by mouth daily.     rosuvastatin (CRESTOR) 5 MG tablet Take 5 mg by mouth every other day.  TOUJEO SOLOSTAR 300 UNIT/ML SOPN Inject 20 Units into the skin daily.  0   acetaminophen (TYLENOL) 500 MG tablet Take 1,000 mg by mouth every 6 (six) hours as needed for moderate pain.     Current Facility-Administered Medications  Medication Dose Route Frequency Provider Last Rate Last Admin   0.9 %  sodium chloride infusion  500 mL Intravenous Once Sharyn Creamer, MD       0.9 %  sodium chloride infusion  500 mL Intravenous Once Sharyn Creamer, MD        Allergies as of 12/22/2022   (No Known Allergies)    Family History  Problem Relation Age of Onset   Diabetes Mother    Heart disease Father    Heart disease Other    Colon cancer Neg Hx    Colon polyps Neg Hx    Esophageal cancer Neg Hx    Rectal cancer Neg Hx    Stomach cancer Neg Hx      Social History   Socioeconomic History   Marital status: Married    Spouse name: Not on file   Number of children: Not on file   Years of education: Not on file   Highest education level: Not on file  Occupational History   Not on file  Tobacco Use   Smoking status: Never   Smokeless tobacco: Never  Vaping Use   Vaping Use: Never used  Substance and Sexual Activity   Alcohol use: Yes    Alcohol/week: 4.0 standard drinks of alcohol    Types: 4 Glasses of wine per week   Drug use: No   Sexual activity: Not on file  Other Topics Concern   Not on file  Social History Narrative   He is happily married.  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  He has    three children, all who have graduated from college, two from [place] and    one from [place].         Social Determinants of Health   Financial Resource Strain: Not on file  Food Insecurity: Not on file  Transportation Needs: Not on file  Physical Activity: Not on file  Stress: Not on file  Social Connections: Not on file  Intimate Partner Violence: Not on file    Physical Exam: Vital signs in last 24 hours: BP (!) 151/69 (BP Location: Right Arm, Patient Position: Sitting, Cuff Size: Normal)   Pulse 94   Temp (!) 97.3 F (36.3 C) (Temporal)   Ht 6' 2"$  (1.88 m)   Wt 185 lb (83.9 kg)   SpO2 97%   BMI 23.75 kg/m  GEN: NAD EYE: Sclerae anicteric ENT: MMM CV: Non-tachycardic Pulm: No increased work of breathing GI: Soft, NT/ND NEURO:  Alert & Oriented   Christia Reading, MD [facility]  12/22/2022 2:06 PM

## 2022-12-22 NOTE — Patient Instructions (Signed)
Resume previous diet, resume previous medications. Handout/information given for hemorrhoids, diverticulosis and polyps.  YOU HAD AN ENDOSCOPIC PROCEDURE TODAY AT [facility]:   Refer to the procedure report that was given to you for any specific questions about what was found during the examination.  If the procedure report does not answer your questions, please call your gastroenterologist to clarify.  If you requested that your care partner not be given the details of your procedure findings, then the procedure report has been included in a sealed envelope for you to review at your convenience later.  YOU SHOULD EXPECT: Some feelings of bloating in the abdomen. Passage of more gas than usual.  Walking can help get rid of the air that was put into your GI tract during the procedure and reduce the bloating. If you had a lower endoscopy (such as a colonoscopy or flexible sigmoidoscopy) you may notice spotting of blood in your stool or on the toilet paper. If you underwent a bowel prep for your procedure, you may not have a normal bowel movement for a few days.  Please Note:  You might notice some irritation and congestion in your nose or some drainage.  This is from the oxygen used during your procedure.  There is no need for concern and it should clear up in a day or so.  SYMPTOMS TO REPORT IMMEDIATELY:  Following lower endoscopy (colonoscopy):  Excessive amounts of blood in the stool  Significant tenderness or worsening of abdominal pains  Swelling of the abdomen that is new, acute  Fever of 100F or higher  For urgent or emergent issues, a gastroenterologist can be reached at any hour by calling [phone]. Do not use MyChart messaging for urgent concerns.   DIET:  We do recommend a small meal at first, but then you may proceed to your regular diet.  Drink plenty of fluids but you should avoid alcoholic beverages for 24 hours.  ACTIVITY:  You should plan to take it easy  for the rest of today and you should NOT DRIVE or use heavy machinery until tomorrow (because of the sedation medicines used during the test).    FOLLOW UP: Our staff will call the number listed on your records the next business day following your procedure.  We will call around 7:15- 8:00 am to check on you and address any questions or concerns that you may have regarding the information given to you following your procedure. If we do not reach you, we will leave a message.     If any biopsies were taken you will be contacted by phone or by letter within the next 1-3 weeks.  Please call us at [phone] if you have not heard about the biopsies in 3 weeks.    SIGNATURES/CONFIDENTIALITY: You and/or your care partner have signed paperwork which will be entered into your electronic medical record.  These signatures attest to the fact that that the information above on your After Visit Summary has been reviewed and is understood.  Full responsibility of the confidentiality of this discharge information lies with you and/or your care-partner.

## 2022-12-22 NOTE — Progress Notes (Signed)
Vss nad trans to pacu °

## 2022-12-23 ENCOUNTER — Telehealth: Payer: Self-pay | Admitting: *Deleted

## 2022-12-23 NOTE — Telephone Encounter (Signed)
Attempted to call patient for their post-procedure follow-up call. No answer. Left voicemail.   

## 2022-12-29 ENCOUNTER — Encounter: Payer: Self-pay | Admitting: Internal Medicine

## 2023-02-23 DIAGNOSIS — K080 Exfoliation of teeth due to systemic causes: Secondary | ICD-10-CM | POA: Diagnosis not present

## 2023-03-23 DIAGNOSIS — K080 Exfoliation of teeth due to systemic causes: Secondary | ICD-10-CM | POA: Diagnosis not present

## 2023-03-24 DIAGNOSIS — E785 Hyperlipidemia, unspecified: Secondary | ICD-10-CM | POA: Diagnosis not present

## 2023-03-24 DIAGNOSIS — R972 Elevated prostate specific antigen [PSA]: Secondary | ICD-10-CM | POA: Diagnosis not present

## 2023-03-24 DIAGNOSIS — I129 Hypertensive chronic kidney disease with stage 1 through stage 4 chronic kidney disease, or unspecified chronic kidney disease: Secondary | ICD-10-CM | POA: Diagnosis not present

## 2023-03-24 DIAGNOSIS — E1169 Type 2 diabetes mellitus with other specified complication: Secondary | ICD-10-CM | POA: Diagnosis not present

## 2023-03-24 DIAGNOSIS — N1831 Chronic kidney disease, stage 3a: Secondary | ICD-10-CM | POA: Diagnosis not present

## 2023-03-24 DIAGNOSIS — I10 Essential (primary) hypertension: Secondary | ICD-10-CM | POA: Diagnosis not present

## 2023-03-24 DIAGNOSIS — Z794 Long term (current) use of insulin: Secondary | ICD-10-CM | POA: Diagnosis not present

## 2023-05-21 DIAGNOSIS — I700 Atherosclerosis of aorta: Secondary | ICD-10-CM | POA: Diagnosis not present

## 2023-07-29 DIAGNOSIS — R1031 Right lower quadrant pain: Secondary | ICD-10-CM | POA: Diagnosis not present

## 2023-07-29 DIAGNOSIS — R109 Unspecified abdominal pain: Secondary | ICD-10-CM | POA: Diagnosis not present

## 2023-07-29 DIAGNOSIS — I129 Hypertensive chronic kidney disease with stage 1 through stage 4 chronic kidney disease, or unspecified chronic kidney disease: Secondary | ICD-10-CM | POA: Diagnosis not present

## 2023-08-03 DIAGNOSIS — Z125 Encounter for screening for malignant neoplasm of prostate: Secondary | ICD-10-CM | POA: Diagnosis not present

## 2023-08-03 DIAGNOSIS — N5201 Erectile dysfunction due to arterial insufficiency: Secondary | ICD-10-CM | POA: Diagnosis not present

## 2023-08-04 DIAGNOSIS — H43811 Vitreous degeneration, right eye: Secondary | ICD-10-CM | POA: Diagnosis not present

## 2023-08-04 DIAGNOSIS — Z961 Presence of intraocular lens: Secondary | ICD-10-CM | POA: Diagnosis not present

## 2023-08-04 DIAGNOSIS — H11153 Pinguecula, bilateral: Secondary | ICD-10-CM | POA: Diagnosis not present

## 2023-08-04 DIAGNOSIS — H18413 Arcus senilis, bilateral: Secondary | ICD-10-CM | POA: Diagnosis not present

## 2023-09-15 DIAGNOSIS — K080 Exfoliation of teeth due to systemic causes: Secondary | ICD-10-CM | POA: Diagnosis not present

## 2023-09-17 DIAGNOSIS — N5201 Erectile dysfunction due to arterial insufficiency: Secondary | ICD-10-CM | POA: Diagnosis not present

## 2023-09-25 DIAGNOSIS — E1169 Type 2 diabetes mellitus with other specified complication: Secondary | ICD-10-CM | POA: Diagnosis not present

## 2023-09-25 DIAGNOSIS — E785 Hyperlipidemia, unspecified: Secondary | ICD-10-CM | POA: Diagnosis not present

## 2023-09-25 DIAGNOSIS — R972 Elevated prostate specific antigen [PSA]: Secondary | ICD-10-CM | POA: Diagnosis not present

## 2023-10-02 DIAGNOSIS — I10 Essential (primary) hypertension: Secondary | ICD-10-CM | POA: Diagnosis not present

## 2023-10-02 DIAGNOSIS — E1169 Type 2 diabetes mellitus with other specified complication: Secondary | ICD-10-CM | POA: Diagnosis not present

## 2023-10-02 DIAGNOSIS — Z1389 Encounter for screening for other disorder: Secondary | ICD-10-CM | POA: Diagnosis not present

## 2023-10-02 DIAGNOSIS — I129 Hypertensive chronic kidney disease with stage 1 through stage 4 chronic kidney disease, or unspecified chronic kidney disease: Secondary | ICD-10-CM | POA: Diagnosis not present

## 2023-10-02 DIAGNOSIS — Z23 Encounter for immunization: Secondary | ICD-10-CM | POA: Diagnosis not present

## 2023-10-02 DIAGNOSIS — Z1331 Encounter for screening for depression: Secondary | ICD-10-CM | POA: Diagnosis not present

## 2023-10-02 DIAGNOSIS — R82998 Other abnormal findings in urine: Secondary | ICD-10-CM | POA: Diagnosis not present

## 2023-10-02 DIAGNOSIS — Z0000 Encounter for general adult medical examination without abnormal findings: Secondary | ICD-10-CM | POA: Diagnosis not present

## 2023-11-25 DIAGNOSIS — D225 Melanocytic nevi of trunk: Secondary | ICD-10-CM | POA: Diagnosis not present

## 2023-11-25 DIAGNOSIS — Z85828 Personal history of other malignant neoplasm of skin: Secondary | ICD-10-CM | POA: Diagnosis not present

## 2023-11-25 DIAGNOSIS — L570 Actinic keratosis: Secondary | ICD-10-CM | POA: Diagnosis not present

## 2023-11-25 DIAGNOSIS — L821 Other seborrheic keratosis: Secondary | ICD-10-CM | POA: Diagnosis not present

## 2023-11-25 DIAGNOSIS — L905 Scar conditions and fibrosis of skin: Secondary | ICD-10-CM | POA: Diagnosis not present

## 2023-12-22 NOTE — Progress Notes (Signed)
 Cardiology Office Note   Date:  01/01/2024   ID:  AVIAN KONIGSBERG, DOB 04/21/1946, MRN [id number]  PCP:  Charlane Ferretti, DO  Cardiologist:  Dr. Eden Emms    No chief complaint on file.     History of Present Illness: Glenn Moore is a 78 y.o. male who presents for HTN, HLD and CAD follow up.  Has had intermittent LBBB in past   He had a normal cardac CTA in 2007 and a calcium score of 12. A1c was 7.5.  Stress test 08/2014 was normal .  He continues to Golf walks a lot There is no associated SOB, diaphoresis, palpitations or syncope.   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 Now seeing his wife's sister who lost her Husband from spinal cord infarct after 4 years of being bed ridden.    Past Medical History:  Diagnosis Date   Arthritis    Cataract    Chest pain    DM (diabetes mellitus) (HCC)    HTN (hypertension)     Past Surgical History:  Procedure Laterality Date   CATARACT EXTRACTION  2021   COLONOSCOPY     PILONIDAL CYST EXCISION       Current Outpatient Medications  Medication Sig Dispense Refill   amLODipine (NORVASC) 5 MG tablet Take 5 mg by mouth daily.     BD PEN NEEDLE NANO 2ND GEN 32G X 4 MM MISC SMARTSIG:Injection Daily     CONTOUR NEXT TEST test strip daily.     doxazosin (CARDURA) 4 MG tablet Take 4 mg by mouth daily.     INVOKANA 300 MG TABS tablet Take 300 mg by mouth daily.     losartan (COZAAR) 100 MG tablet Take 100 mg by mouth daily.     metFORMIN (GLUCOPHAGE) 850 MG tablet Take 850 mg by mouth 3 (three) times daily.     metoprolol  (TOPROL-XL) 50 MG 24 hr tablet Take 50 mg by mouth daily.     NON FORMULARY Take 3 capsules by mouth daily at 6 (six) AM. Balance of Nature supplement     olmesartan (BENICAR) 40 MG tablet Take 40 mg by mouth daily.     Omega-3 Fatty Acids (FISH OIL) 1000 MG CAPS Take 1 capsule by mouth daily.     rosuvastatin (CRESTOR) 5 MG tablet Take 5 mg by mouth every other day.     TOUJEO SOLOSTAR 300 UNIT/ML SOPN Inject 20 Units into the skin daily.  0   triamcinolone cream (KENALOG) 0.1 % SMARTSIG:1 Application Topical 2-3 Times Daily     acetaminophen (TYLENOL) 500 MG tablet Take 1,000 mg by mouth every 6 (six) hours as needed for moderate pain.     No current facility-administered medications for this visit.    Allergies:   Patient has no known allergies.    Social History:  The patient  reports that he has never smoked. He has never used smokeless tobacco. He reports current alcohol use of about 4.0 standard drinks of alcohol per  week. He reports that he does not use drugs.   Family History:  The patient's family history includes Diabetes in his mother; Heart disease in his father and another family member.    ROS:  General:no colds or fevers, no weight changes Skin:no rashes or ulcers HEENT:no blurred vision, no congestion CV:see HPI PUL:see HPI GI:no diarrhea constipation or melena, no indigestion GU:no hematuria, no dysuria MS:no joint pain, no claudication Neuro:no syncope, no lightheadedness Endo:+ diabetes, no thyroid disease  Wt Readings from Last 3 Encounters:  01/01/24 191 lb 12.8 oz (87 kg)  12/22/22 185 lb (83.9 kg)  11/25/22 178 lb (80.7 kg)     PHYSICAL EXAM: VS:  BP (!) 140/76   Pulse 62   Ht 6\' 2"  (1.88 m)   Wt 191 lb 12.8 oz (87 kg)   SpO2 97%   BMI 24.63 kg/m  , BMI Body mass index is 24.63 kg/m.  Affect appropriate Healthy:  appears stated age HEENT: normal Neck supple with no adenopathy JVP normal left  bruits no thyromegaly Lungs clear with no  wheezing and good diaphragmatic motion Heart:  S1/S2 no murmur, no rub, gallop or click PMI normal Abdomen: benighn, BS positve, no tenderness, no AAA no bruit.  No HSM or HJR Distal pulses intact with no bruits No edema Neuro non-focal Skin warm and dry No muscular weakness    EKG:   01/01/2024 SR rate 63 LBBB    Recent Labs: No results found for requested labs within last 365 days.    Lipid Panel No results found for: "CHOL", "TRIG", "HDL", "CHOLHDL", "VLDL", "LDLCALC", "LDLDIRECT"     Other studies Reviewed: Additional studies/ records that were reviewed today include: ETT stress myovue 10/25/18    ASSESSMENT AND PLAN:  1.  HTN Well controlled.  Continue current medications and low sodium Dash type diet.    2. CAD:   High calcium score for age Normal myovue  10/25/18 active with no angina continue beta blocker asa and statin   3. Hyperlipidemia followed by Dr. Chilton Si LDL 48 on crestor   4. DM-2 Discussed low carb diet.  Target hemoglobin A1c is 6.5 or less.  Continue current medications.  5. LBBB:  New 2019 yearly ECG no evidence of high grade heart block ECG today with LBBB    Current medicines are reviewed with the patient today.  The patient Has no concerns regarding medicines.  The following changes have been made:  None  Labs/ tests ordered today include:None   Disposition:   FU:  In a year   Signed, Charlton Haws, MD  01/01/2024 9:17 AM    [facility] [address] [place], [place], [place]  [address]/ [address] [place], [place] Phone: [phone]; Fax: [phone]  [phone]

## 2023-12-23 DIAGNOSIS — N5201 Erectile dysfunction due to arterial insufficiency: Secondary | ICD-10-CM | POA: Diagnosis not present

## 2024-01-01 ENCOUNTER — Encounter: Payer: Self-pay | Admitting: Cardiovascular Disease

## 2024-01-01 ENCOUNTER — Ambulatory Visit: Payer: Medicare Other | Attending: Cardiovascular Disease | Admitting: Cardiovascular Disease

## 2024-01-01 VITALS — BP 140/76 | HR 62 | Ht 74.0 in | Wt 191.8 lb

## 2024-01-01 DIAGNOSIS — E782 Mixed hyperlipidemia: Secondary | ICD-10-CM | POA: Diagnosis not present

## 2024-01-01 DIAGNOSIS — I2510 Atherosclerotic heart disease of native coronary artery without angina pectoris: Secondary | ICD-10-CM | POA: Diagnosis not present

## 2024-01-01 DIAGNOSIS — I447 Left bundle-branch block, unspecified: Secondary | ICD-10-CM | POA: Diagnosis not present

## 2024-01-01 NOTE — Patient Instructions (Signed)

## 2024-03-16 DIAGNOSIS — K080 Exfoliation of teeth due to systemic causes: Secondary | ICD-10-CM | POA: Diagnosis not present

## 2024-03-31 DIAGNOSIS — E1169 Type 2 diabetes mellitus with other specified complication: Secondary | ICD-10-CM | POA: Diagnosis not present

## 2024-03-31 DIAGNOSIS — R972 Elevated prostate specific antigen [PSA]: Secondary | ICD-10-CM | POA: Diagnosis not present

## 2024-03-31 DIAGNOSIS — I129 Hypertensive chronic kidney disease with stage 1 through stage 4 chronic kidney disease, or unspecified chronic kidney disease: Secondary | ICD-10-CM | POA: Diagnosis not present

## 2024-09-27 DIAGNOSIS — K080 Exfoliation of teeth due to systemic causes: Secondary | ICD-10-CM | POA: Diagnosis not present

## 2024-09-27 DIAGNOSIS — E7849 Other hyperlipidemia: Secondary | ICD-10-CM | POA: Diagnosis not present

## 2024-09-27 DIAGNOSIS — H11153 Pinguecula, bilateral: Secondary | ICD-10-CM | POA: Diagnosis not present

## 2024-09-27 DIAGNOSIS — Z961 Presence of intraocular lens: Secondary | ICD-10-CM | POA: Diagnosis not present

## 2024-09-27 DIAGNOSIS — H43811 Vitreous degeneration, right eye: Secondary | ICD-10-CM | POA: Diagnosis not present

## 2024-09-27 DIAGNOSIS — H18413 Arcus senilis, bilateral: Secondary | ICD-10-CM | POA: Diagnosis not present

## 2024-10-12 NOTE — Progress Notes (Signed)
 Glenn Moore                                          MRN: [id number]   10/12/2024   The [facility] Quality Team Specialist reviewed this patient medical record for the purposes of chart review for care gap closure. The following were reviewed: chart review for care gap closure-kidney health evaluation for diabetes:eGFR  and uACR.    [facility] Quality Team

## 2024-10-21 DIAGNOSIS — R82998 Other abnormal findings in urine: Secondary | ICD-10-CM | POA: Diagnosis not present

## 2024-10-21 DIAGNOSIS — Z1331 Encounter for screening for depression: Secondary | ICD-10-CM | POA: Diagnosis not present

## 2024-10-21 DIAGNOSIS — Z1339 Encounter for screening examination for other mental health and behavioral disorders: Secondary | ICD-10-CM | POA: Diagnosis not present

## 2024-10-21 DIAGNOSIS — I10 Essential (primary) hypertension: Secondary | ICD-10-CM | POA: Diagnosis not present

## 2024-10-21 DIAGNOSIS — E1169 Type 2 diabetes mellitus with other specified complication: Secondary | ICD-10-CM | POA: Diagnosis not present

## 2024-10-21 DIAGNOSIS — Z23 Encounter for immunization: Secondary | ICD-10-CM | POA: Diagnosis not present

## 2024-10-21 DIAGNOSIS — Z0000 Encounter for general adult medical examination without abnormal findings: Secondary | ICD-10-CM | POA: Diagnosis not present

## 2024-12-26 ENCOUNTER — Ambulatory Visit: Admitting: Cardiovascular Disease
